# Patient Record
Sex: Female | Born: 1935 | Race: White | Hispanic: No | Smoking: Former smoker
Health system: Southern US, Community
[De-identification: ages and names within clinical notes are randomized; demographics above are authoritative.]

## PROBLEM LIST (undated history)

## (undated) DIAGNOSIS — M1990 Unspecified osteoarthritis, unspecified site: Secondary | ICD-10-CM

## (undated) DIAGNOSIS — E079 Disorder of thyroid, unspecified: Secondary | ICD-10-CM

## (undated) DIAGNOSIS — J449 Chronic obstructive pulmonary disease, unspecified: Secondary | ICD-10-CM

## (undated) HISTORY — PX: ABDOMINAL HYSTERECTOMY: SHX81

---

## 2005-10-12 ENCOUNTER — Emergency Department (HOSPITAL_COMMUNITY): Admission: EM | Admit: 2005-10-12 | Discharge: 2005-10-12 | Payer: Self-pay | Admitting: Emergency Medicine

## 2005-11-11 ENCOUNTER — Inpatient Hospital Stay (HOSPITAL_COMMUNITY): Admission: EM | Admit: 2005-11-11 | Discharge: 2005-11-13 | Payer: Self-pay | Admitting: Emergency Medicine

## 2006-03-20 ENCOUNTER — Inpatient Hospital Stay (HOSPITAL_COMMUNITY): Admission: EM | Admit: 2006-03-20 | Discharge: 2006-03-23 | Payer: Self-pay | Admitting: Emergency Medicine

## 2006-05-19 ENCOUNTER — Encounter (HOSPITAL_COMMUNITY): Admission: RE | Admit: 2006-05-19 | Discharge: 2006-06-18 | Payer: Self-pay | Admitting: Endocrinology

## 2006-07-17 ENCOUNTER — Emergency Department (HOSPITAL_COMMUNITY): Admission: EM | Admit: 2006-07-17 | Discharge: 2006-07-17 | Payer: Self-pay | Admitting: Emergency Medicine

## 2006-08-14 ENCOUNTER — Emergency Department (HOSPITAL_COMMUNITY): Admission: EM | Admit: 2006-08-14 | Discharge: 2006-08-15 | Payer: Self-pay | Admitting: Emergency Medicine

## 2007-02-01 ENCOUNTER — Emergency Department (HOSPITAL_COMMUNITY): Admission: EM | Admit: 2007-02-01 | Discharge: 2007-02-01 | Payer: Self-pay | Admitting: Emergency Medicine

## 2007-02-25 ENCOUNTER — Emergency Department (HOSPITAL_COMMUNITY): Admission: EM | Admit: 2007-02-25 | Discharge: 2007-02-25 | Payer: Self-pay | Admitting: Emergency Medicine

## 2008-11-22 IMAGING — CR DG CHEST 2V
2 series · 2 of 2 positions shown · non-contrast
Comparison: 10/12/2005, 03/20/2006, CT chest 03/22/2006

CLINICAL DATA: COPD. Chest pain.

[view not recorded (1 of 2)]
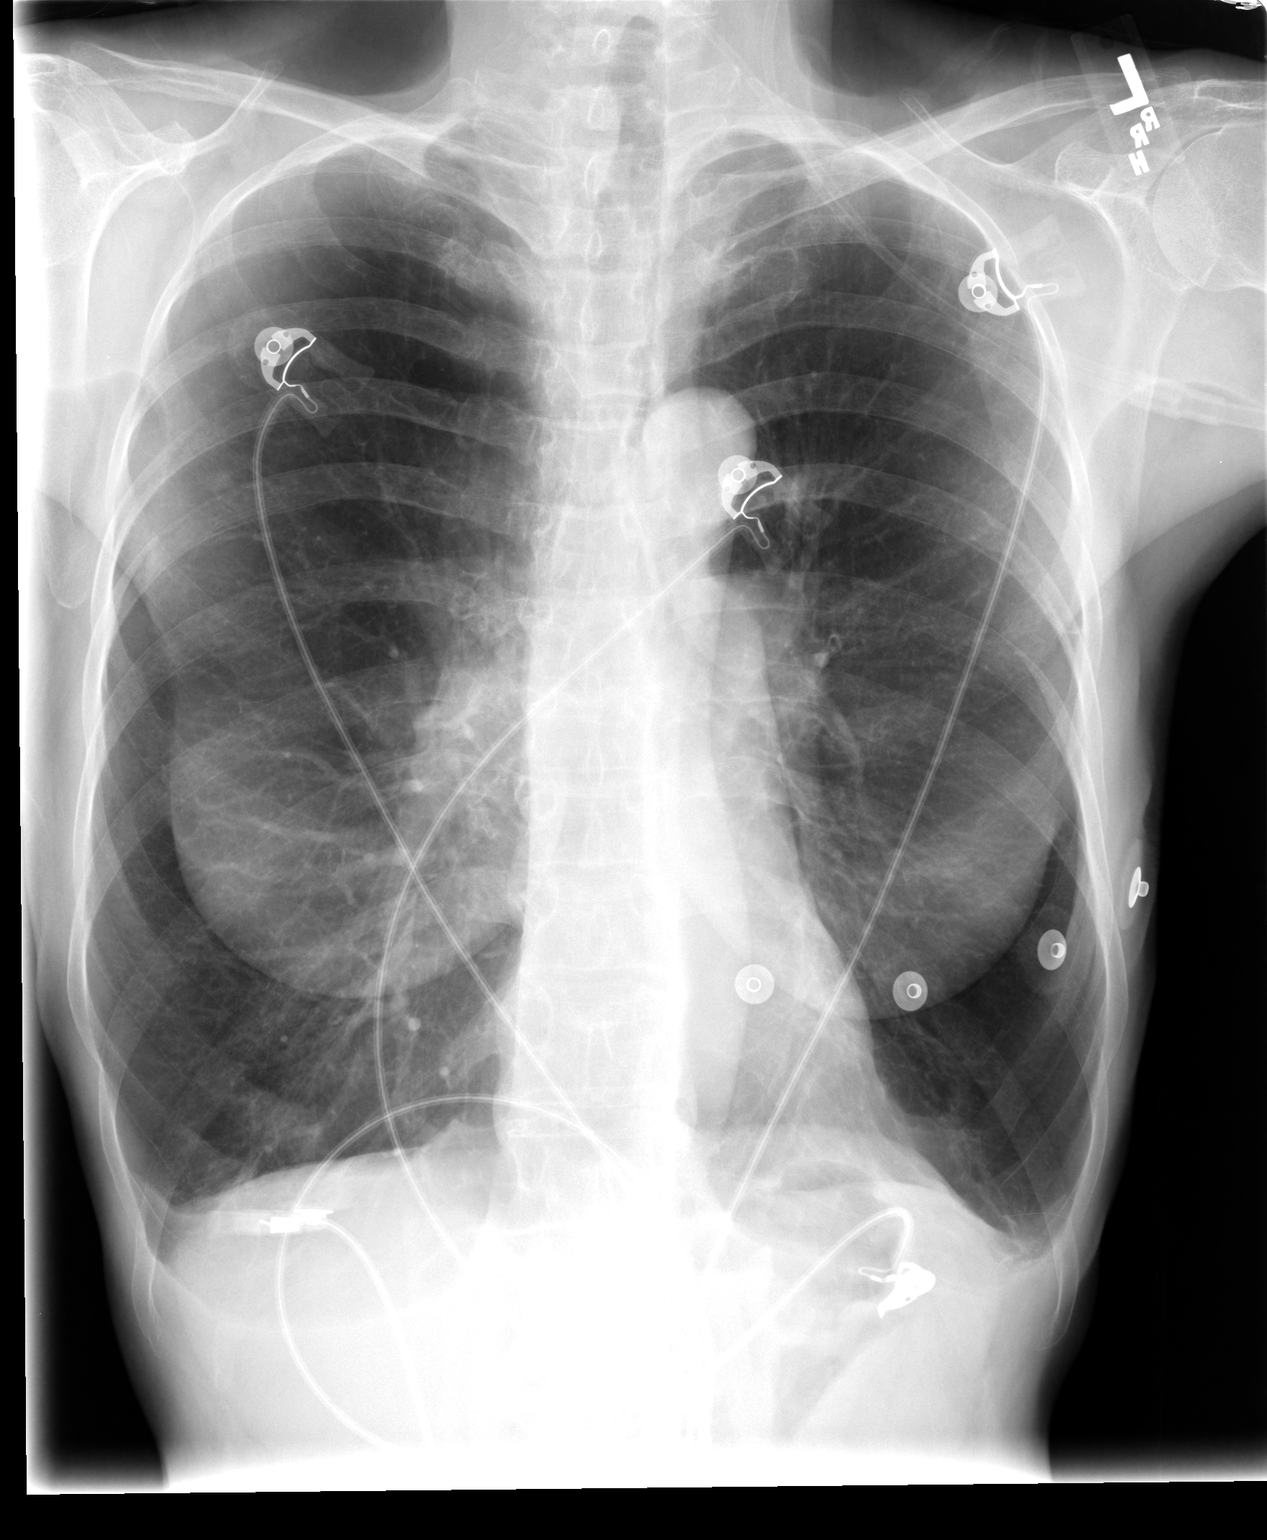

[view not recorded (2 of 2)]
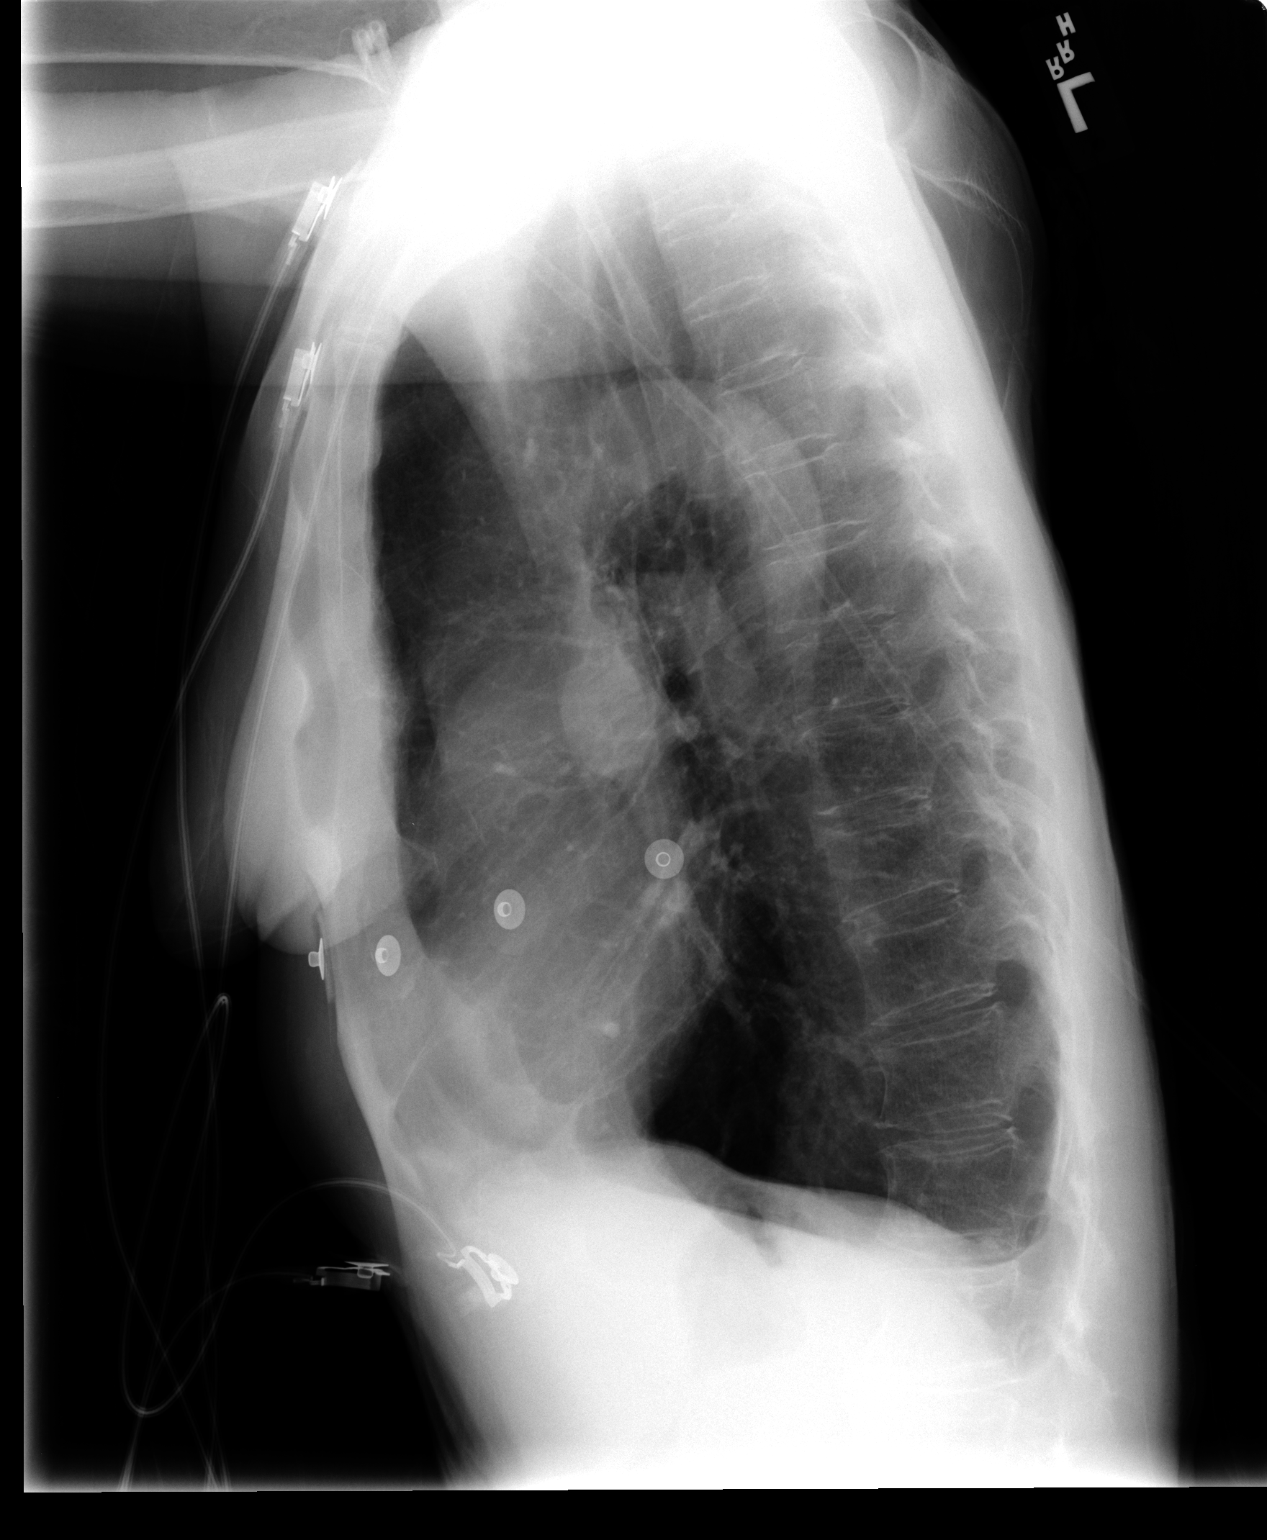

[2 of 2 positions shown; findings below may reference images not displayed]

CHEST - 2 VIEW:

Lungs are hyperexpanded with mild chronic interstitial coarsening. The
cardiopericardial silhouette is within normal limits for size. Pulmonary nodule
is seen in the right upper lobe. This was present as far back as are always
comparison study from 10/12/2005. Would appear slightly more prominent than that
time, it appears unchanged when comparing to 03/20/2006. This was seen on the CT
scan from 03/22/2006 and described as a probable granuloma at that time.
Conservatively, a followup chest x-ray could be performed in 6 months to ensure
that continues remained stable.

There is no pulmonary edema. No focal airspace consolidation. A limited leads
overlie the chest.
IMPRESSION: Emphysema without edema or focal infiltrate.

Right upper lobe pulmonary nodule. Please see report above. The patient did have
a CT scan subsequent to this plain film study. Please see that report for full
details.

This exam was performed during a hospital PACS downtime.  As such, the exam
could not be dictated at the time that it was performed.  A preliminary report
was generated at the time that the exam was completed and that preliminary
report is included in the scanned documents.  A final report is now being
dictated for the medical record.

## 2009-11-28 ENCOUNTER — Ambulatory Visit (HOSPITAL_COMMUNITY): Admission: RE | Admit: 2009-11-28 | Discharge: 2009-11-28 | Payer: Self-pay | Admitting: Ophthalmology

## 2010-02-23 ENCOUNTER — Encounter: Payer: Self-pay | Admitting: *Deleted

## 2010-04-16 LAB — BASIC METABOLIC PANEL
BUN: 9 mg/dL (ref 6–23)
Creatinine, Ser: 0.59 mg/dL (ref 0.4–1.2)
GFR calc non Af Amer: >60 mL/min (ref >60)
Glucose, Bld: 76 mg/dL (ref 70–99)
Potassium: 4.2 mEq/L (ref 3.5–5.1)

## 2010-06-20 NOTE — Discharge Summary (Signed)
NAMEMIRIAM, [name]                 ACCOUNT NO.:  [id number]   MEDICAL RECORD NO.:  [id number]          PATIENT TYPE:  INP   LOCATION:  A322                          FACILITY:  [facility]   PHYSICIAN:  Marcello Moores, MD   DATE OF BIRTH:  1935-05-27   DATE OF ADMISSION:  03/20/2006  DATE OF DISCHARGE:  02/19/2008LH                               DISCHARGE SUMMARY   PRIMARY CARE PHYSICIAN:  Dr. Samuel Jester at [facility].   HOSPITAL COURSE:  [name] is a 75 year old female patient who has  history of COPD and hyperthyroidism. She presented with shortness of  breath and admitted with exacerbation of COPD and was put on Atrovent,  albuterol treatments every 4 hours to which she responded well. During  her hospital stay a chest x-ray was done which shows emphysematous  changes and a small right upper lung nodule with a possible small  effusion.  CAT scan of the chest followed to evaluate the nodule which  showed a benign appearing right upper lobe small nodule. In addition it  showed a large multi-nodular goiter with extension to substernal area  and it was mentioned that a questionable carcinoma cannot be excluded as  well; there is COPD and emphysema. Otherwise the patient was stable and  responded well without any respiratory distress. Today she is ready to  go home.   PHYSICAL EXAMINATION:  GENERAL: The patient is sitting with her husband  on the chair without any distress.  VITAL SIGNS: Blood pressure is 106/72 and temperature is 97.6,  respiratory rate is 20, pulse rate is 76 per minute. She is saturating  94% on room air.  HEENT: Conjunctivae pink, anicteric sclerae.  NECK: Supple with a large thyroid gland.  CHEST: Good air entry, clear, no rhonchi or wheezes.  CVS: S1, S2 regular, no murmur.  ABDOMEN: Flat, soft, no organomegaly, no tenderness, normal active bowel  sounds.  EXTREMITIES: No pedal edema. Peripheral pulses positive.  CNS: Alert and oriented.   LABORATORY DATA:   CBC within normal range. Chemistry was in normal  range. CAT scan of the chest as mentioned above with an enlarged thyroid  gland, multi-nodular questionable carcinoma.   ASSESSMENT DURING DISCHARGE:  1. Chronic obstructive pulmonary disease exacerbation, resolved.  2. Hyperthyroidism with a multi-nodular goiter. She needs ultrasound      of the thyroid gland and a scan.  3. Hypertension, stable.   DISCUSSION/PLAN:  The patient is currently stable but needs ultrasound  of the thyroid gland to evaluate for carcinoma, to be followed by  possible scanning and biopsy but the patient wants to go and have all  this done with her primary care physician, Dr. Charm Barges at [facility] and  request for ultrasound was given. The patient was with her husband and  she had her husband want to go and have follow-up and have all  investigation for the thyroid gland with her primary care physician.  They are aware of the possibilities and they prefer to go there and have  follow-up rather than staying until we investigate the thyroid gland.   HOME MEDICATIONS:  1. Diltiazem 120 mg p.o. once daily.  2. Albuterol/Ipratropium nebulizer q.4 hours.  3. Calcium carbonate 600 mg p.o. once daily.  4. Metamucil 10 mg p.o. b.i.d.  5. Fosamax & vitamin D 70 mg p.o. once a week.   She has all her medications at home and knows how to take it.      Marcello Moores, MD  Electronically Signed     MT/MEDQ  D:  03/23/2006  T:  03/23/2006  Job:  [id number]   cc:   Samuel Jester  Fax: [phone]

## 2010-06-20 NOTE — Discharge Summary (Signed)
[name], [name]                 ACCOUNT NO.:  [id number]   MEDICAL RECORD NO.:  [id number]          PATIENT TYPE:  INP   LOCATION:  A204                          FACILITY:  [facility]   PHYSICIAN:  Hanley Hays. Dechurch, M.D.DATE OF BIRTH:  10/26/1935   DATE OF ADMISSION:  11/11/2005  DATE OF DISCHARGE:  10/12/2007LH                               DISCHARGE SUMMARY   DISCHARGE SUMMARY   DIAGNOSIS:  1. Chronic obstructive pulmonary disease exacerbation, hypoacute.  2. Supra-therapeutic hypothyroidism.  3. Tachycardia.  4. Anemia with iron deficiency and mixed indices.  5. History of gastroesophageal reflux.  6. Tobacco abuse.  7. History of medication noncompliance.   DISPOSITION:  Patient is discharged to home.   MEDICATIONS:  Combivent 2 puffs q.4 hours p.r.n. wheezing.  Prilosec 20 mg daily.  Nu-Iron 150 mg daily.  Levaquin 500 mg to complete a 7-day course.  Cardizem CD 120 mg daily.  Oxygen 2 liters per minutes.   Follow up with Dr. Johny Chess as scheduled.  Follow up with Dr. Charm Barges 1  week.  Condition improved.   HOSPITAL COURSE:  [age]-year-old female with history of COPD  recently hospitalized at [facility] with COPD exacerbation presented to  the hospital with upper respiratory type symptoms initially with  worsening shortness of breath and wheezing and slightly productive  cough.  Evaluation was consistent with acute COPD exacerbation.  She was  also noted to be tachycardic.  She states this was a reaction to her  previous steroid treatment at the other hospital.  Review reveals that  she is hyperthyroid with a TSH of 0.012 and a T4 of 2.33.  She was not  on any known thyroid replacement.  She was also, apparently after  hydration her hemoglobin decreased from 13 to 11.5 and iron indices  obtained revealed a TIBC of 226, total iron 22 and iron sat at 10%.  B12  was 526, RBC folate was 1152, ferritin 160.  Likely related to mixed  issues including iron deficiency.   She clinically improved and was  stable for discharge.  She is being discharged to home with the followup  as noted above.  Her heart rates were reasonably  controlled.  She had no symptomatic hypotension.  Heart rates remained  in the 80s to 90s and blood pressure was 108/60 at the time of  discharge.  She had no fever.  She remained clinically stable and was  discharged to home in stable condition with the plan as noted above.      Hanley Hays Josefine Class, M.D.  Electronically Signed     FED/MEDQ  D:  03/18/2006  T:  03/18/2006  Job:  [id number]

## 2010-06-20 NOTE — H&P (Signed)
NAMEBAYLEA, [name]                 ACCOUNT NO.:  [id number]   MEDICAL RECORD NO.:  [id number]          PATIENT TYPE:  INP   LOCATION:  A322                          FACILITY:  [facility]   PHYSICIAN:  Margaretmary Dys, M.D.DATE OF BIRTH:  1935-11-24   DATE OF ADMISSION:  03/20/2006  DATE OF DISCHARGE:  LH                              HISTORY & PHYSICAL   PRIMARY CARE PHYSICIAN:  The patient is unassigned.   ADMISSION DIAGNOSES:  1. Acute chronic obstructive pulmonary disease exacerbation.  2. Hypoxic respiratory failure.  3. History of tobacco abuse.   CHIEF COMPLAINT:  Increasing shortness of breath and cough for about the  past week.   HISTORY OF PRESENT ILLNESS:  [name] is a 75 year old Caucasian female  who presented to the emergency room with complaints of shortness of  breath and cough over the past one week.  The shortness of breath was  worse over the past two days.  She reports cough with difficulty  bringing up the sputum.  She has had no fevers or chills.  She has no  chest pain.  She has also been hearing herself wheezing.  She tried  using albuterol inhaler  and nebulizers at home without much help.  The  patient is on home oxygen at 2 liters a minute.  She has end-stage COPD  and has been on oxygen for more than a year now.  Her last exacerbation  was back in October 2007.  She denies any angina-like pain, no headaches  or dizziness.  No nausea or vomiting.   REVIEW OF SYSTEMS:  Ten-point Review of Systems otherwise negative  except as mentioned in History of Present Illness.   PAST MEDICAL HISTORY:  1. Hypertension.  2. Hypothyroidism.  3. History of COPD with last exacerbation October 2007.  The patient      reports that she does not take Pneumovax or flu shots.  4. History of tobacco abuse.   MEDICATIONS:  1. Synthroid 1 a day. The dose is not clear.  2. Diltiazem 120 mg p.o. once a day.  3. Albuterol/ipratropium nebulizers q. 4 h.  4. Calcium carbonate  600 mg p.o. once a day.  5. Methimazole 10 mg p.o. b.i.d.  6. Fosamax Plus D 70 mg p.o. once a week.   ALLERGIES:  The patient reports multiple allergies to PREDNISONE,  ASPIRIN, and SULFA.  She also has allergies to [facility] with heart  racing.  These allergies are really unclear, especially the effects of  the prednisone and also the CARDIZEM which she was given when her heart  rate was actually fast.   SOCIAL HISTORY:  The patient is married, lives with husband.  She quit  smoking several years ago but continues to snuff tobacco.  She denies  any alcohol or IV drug abuse.   FAMILY HISTORY:  Positive for hypertension.  No COPD or lung cancer.   PHYSICAL EXAMINATION:  GENERAL:  Conscious, alert, in mild respiratory  distress.  VITAL SIGNS: On arrival in the emergency room showed blood pressure  138/83, pulse 102, respirations 24, temperature 97.3.  Oxygen saturation  initially was 88% on room air.  Temperature was 100% on 2 liters.  HEENT: Normocephalic and atraumatic.  Oral mucosa moist.  No exudates.  NECK:  Supple.  No JVD or lymphadenopathy.  LUNGS:  Bilateral rhonchi with end-expiratory wheezes.  HEART: S1, S2 regular.  No S3, S4, gallops, or rubs.  ABDOMEN: Soft, nontender.  Bowel sounds positive.  No masses palpable.  EXTREMITIES: No edema.   LABORATORY DATA:  Blood gas on room air shows pH 7.38, pCO2 46, pO2 57,  bicarbonate 26.4.  White blood cell count 6.7, hemoglobin 14.5,  hematocrit 43.5.  Her platelet count was 251, no left shift.  D-dimer  was negative.  BMET showed sodium 136, potassium 3.8, chloride 98, CO2  30, glucose 98, creatinine 0.6.  B-natruretic peptide was negative.   Chest x-ray showed emphysematous changes with some nodular densities in  the right lung, possible small effusions.  No obvious pneumonia was  noted.   ASSESSMENT AND PLAN:  [name] is a 75 year old female with history of  end-stage chronic obstructive pulmonary disease on home oxygen.   She  comes in with what looks like a chronic obstructive pulmonary disease  exacerbation with hypoxic respiratory failure.  Will put patient on  oxygen therapy, antibiotics, and also nebulization.  I doubt that she  clearly has an allergy to Solu-Medrol or prednisone and will have to  weigh the benefits and risks if she becomes increasingly more wheezy or  dyspneic.  I have discussed the above plan with her.  Will also resume  all of her home medications at this time.  Deep vein thrombosis  prophylaxis with Lovenox, gastrointestinal prophylaxis with Protonix.      Margaretmary Dys, M.D.  Electronically Signed     AM/MEDQ  D:  03/21/2006  T:  03/21/2006  Job:  [id number]

## 2010-06-20 NOTE — Group Therapy Note (Signed)
[name], [name]                 ACCOUNT NO.:  [id number]   MEDICAL RECORD NO.:  [id number]          PATIENT TYPE:  INP   LOCATION:  A322                          FACILITY:  [facility]   PHYSICIAN:  Margaretmary Dys, M.D.DATE OF BIRTH:  1935-07-15   DATE OF PROCEDURE:  03/21/2006  DATE OF DISCHARGE:                                 PROGRESS NOTE   SUBJECTIVE:  The patient feels slightly better, although she is still  having some trouble with cough with yellow sputum.  She states she  coughs really hard and she begins to wheeze.  She has not had any chest  pain.  No fevers or chills.  No nausea or vomiting.  The patient quit  smoking years ago.   OBJECTIVE:  GENERAL:  Conscious, alert, comfortable, not in acute  distress.  VITAL SIGNS:  Blood pressure 122/80.  Pulse 90.  Respirations 20.  Temperature 97.5.  Oxygen saturation was 93% on 3 liters.  HEENT Exam:  Normocephalic, atraumatic.  Oral mucosa was dry.  No  exudate was noted.  NECK:  Supple.  No JVD or lymphadenopathy.  LUNGS:  Markedly reduced air anteriorly bilaterally with some end-  expiratory wheezes.  HEART:  S1, S2 regular.  No murmurs, gallops, or rubs.  ABDOMEN:  Soft, nontender.  Bowel sounds positive.  No masses palpable.  EXTREMITIES:  No edema.   LABORATORY/DIAGNOSTIC DATA:  White blood cell count was 12.7, hemoglobin  15.4, hematocrit 14.  Platelet count was 258 with no left shift.  Sodium  138.  Potassium 4.1.  Chloride 105.  CO2 28.  Glucose 96.  BUN 7.  Creatinine 0.61.   ASSESSMENT AND PLAN:  Acute chronic obstructive pulmonary disease  exacerbation with hypoxic respiratory failure.  The patient is much  better today.  The patient tells me that she wears 2 liters of oxygen at  home.   Plan is to continue on current therapy with Levaquin and nebulizer.   Solu-Medrol was discontinued yesterday.   May have to restart the prednisone if she continues to wheeze or does  not improve.   Will transfer her to  3A.  Continue with prophylaxis with Lovenox and GI  prophylaxis with Protonix.      Margaretmary Dys, M.D.  Electronically Signed    AM/MEDQ  D:  03/21/2006  T:  03/21/2006  Job:  [id number]

## 2010-06-20 NOTE — H&P (Signed)
[name], [name]                 ACCOUNT NO.:  [id number]   MEDICAL RECORD NO.:  [id number]          PATIENT TYPE:  INP   LOCATION:  A204                          FACILITY:  [facility]   PHYSICIAN:  Lonia Blood, M.D.      DATE OF BIRTH:  1935-10-13   DATE OF ADMISSION:  11/11/2005  DATE OF DISCHARGE:  LH                                HISTORY & PHYSICAL   PRIMARY CARE PHYSICIAN:  The patient is unassigned. [facility] PCP is at  [facility].   PRESENTING COMPLAINT:  Shortness of breath and cough.   HISTORY OF PRESENT ILLNESS:  The patient is a 75 year old female with known  history of COPD who was last seen in the emergency room with onset about a  month ago. She was previously hospitalized at [facility] with COPD exacerbation  and treated accordingly. The patient apparently reacted to Solu-Medrol and  prednisone per patient tachycardiac at that time. At the time she has been  doing okay until the past couple of days when she started having sinus  problems. She was having some drainage in the back of her throat and then  later started having shortness of breath and wheezing. She has been having  cough which is slightly productive. No fever. No chest pain. No hemoptysis.   PAST MEDICAL HISTORY:  Significant for COPD, hypothyroidism, and previous  tobacco use.   ALLERGIES:  Patient reported allergy to SULFA, ASPIRIN, SOLU-MEDROL,  PREDNISONE. SOLU-MEDROL and PREDNISONE cause tachycardia.   SOCIAL HISTORY:  The patient lives alone in [place]. She has some help from  time to time, someone coming into the house to help her out. She quit  tobacco two years ago. No alcohol or IV drug use.   FAMILY HISTORY:  Significant only for hypertension.   MEDICATIONS:  1. Combivent 1 puff b.i.d.  2. Prilosec 20 mg daily.  3. Calcium tablets 600 mg daily.   REVIEW OF SYSTEMS:  A 10-point Review of Systems is mainly per HPI.   PHYSICAL EXAMINATION:  VITAL SIGNS:  Temperature 100.1 rectal, blood  pressure 129/87,  pulse 124, respiratory rate 24, and sats 97% on room air.  GENERAL:  She is awake, alert, in obvious respiratory distress. Cachectic,  chronically ill-looking. HEENT:  PERRL. EOMI.  NECK:  Supple. No JVD, no lymphadenopathy.  RESPIRATORY:  Shows very poor entry bilaterally with some expiratory  wheezing. The patient slumped over, use of accessory muscles for  respiration.  CARDIOVASCULAR:  She is tachycardiac.  ABDOMEN:  Soft, scaphoid, nontender with positive bowel sounds.  EXTREMITIES:  No edema, cyanosis or clubbing.   LABORATORIES:  Showed a white count 14,100 with left shift 12,000.  Hemoglobin 13, platelet count 412. D-dimer is negative at 0.25. Negative  cardiac enzymes. Sodium 139, potassium 3.7, chloride 107, CO2 26, glucose  107, BUN 7, creatinine 0.4. Calcium 9.4. Total protein 5.9. Albumin 3.1. AST  31, ALT 90. Alkaline phosphatase 73, total bilirubin 0.5. BNP 39.4.   Chest x-ray showed emphysema without edema. There was interval increase in  left base atelectasis or infiltrate. There is also a 5  mm nodular density in  the right mid lung. Recommendation for follow-up to review chest x-ray.   ASSESSMENT:  Therefore this is 75 year old female presenting with what  appears to be acute chronic obstructive pulmonary edema exacerbation. This  may have been triggered by concomitant pneumonia versus her postnasal drip  or sinus.   PLAN:  1. COPD exacerbation. Will admit the patient. Start her on nebulizers and      antibiotics. The patient apparently believes she is reacting to      steroids. She has had this tachycardia continuously. She reacted only      to prednisone and Solu-Medrol, but it is not clear if those are truly      the cause of her tachycardia as she is already tachycardiac as we      speak. I will give her a trial of Decadron and if needed will use      calcium channel blockers to slow down her heart. Will avoid beta      blockers because of her respiratory  situation.  2. Hypothyroidism. Will check TSH and follow up patient closely.  3. GERD. I will continue some PPIs.  4. Sinusitis. This is per patient's report. I will put her on some nasal      steroids and possibly an H2 blocker. Otherwise, will continue with      current patient's management without a problem.      Lonia Blood, M.D.  Electronically Signed     LG/MEDQ  D:  11/11/2005  T:  11/11/2005  Job:  [id number]

## 2010-10-23 LAB — COMPREHENSIVE METABOLIC PANEL
AST: 17
CO2: 33 — ABNORMAL HIGH
Calcium: 9.5
Creatinine, Ser: 0.68
GFR calc Af Amer: >60
GFR calc non Af Amer: >60
Glucose, Bld: 91

## 2010-10-23 LAB — URINE CULTURE: Colony Count: 6000

## 2010-10-23 LAB — URINALYSIS, ROUTINE W REFLEX MICROSCOPIC
Nitrite: NEGATIVE
Specific Gravity, Urine: 1.01
pH: 7.5

## 2010-10-23 LAB — POCT CARDIAC MARKERS: Myoglobin, poc: 53.6

## 2010-10-23 LAB — CBC
MCHC: 33.1
MCV: 83.8
RBC: 4.83

## 2010-10-23 LAB — DIFFERENTIAL
Lymphocytes Relative: 15
Lymphs Abs: 1.6
Neutrophils Relative %: 77

## 2010-10-23 LAB — TSH: TSH: 0.619

## 2010-10-31 ENCOUNTER — Inpatient Hospital Stay (HOSPITAL_COMMUNITY): Payer: Medicare Other

## 2010-10-31 ENCOUNTER — Emergency Department (HOSPITAL_COMMUNITY): Payer: Medicare Other

## 2010-10-31 ENCOUNTER — Encounter (HOSPITAL_COMMUNITY): Payer: Self-pay | Admitting: Emergency Medicine

## 2010-10-31 ENCOUNTER — Inpatient Hospital Stay (HOSPITAL_COMMUNITY)
Admission: EM | Admit: 2010-10-31 | Discharge: 2010-11-08 | DRG: 190 | Disposition: A | Discharge status: Skilled Nursing Facility | Payer: Medicare Other | Attending: Internal Medicine | Admitting: Internal Medicine

## 2010-10-31 DIAGNOSIS — Z299 Encounter for prophylactic measures, unspecified: Secondary | ICD-10-CM

## 2010-10-31 DIAGNOSIS — Z681 Body mass index (BMI) 19 or less, adult: Secondary | ICD-10-CM

## 2010-10-31 DIAGNOSIS — J9620 Acute and chronic respiratory failure, unspecified whether with hypoxia or hypercapnia: Secondary | ICD-10-CM | POA: Diagnosis present

## 2010-10-31 DIAGNOSIS — R000 Tachycardia, unspecified: Secondary | ICD-10-CM | POA: Diagnosis present

## 2010-10-31 DIAGNOSIS — E0590 Thyrotoxicosis, unspecified without thyrotoxic crisis or storm: Secondary | ICD-10-CM | POA: Diagnosis present

## 2010-10-31 DIAGNOSIS — R197 Diarrhea, unspecified: Secondary | ICD-10-CM | POA: Diagnosis not present

## 2010-10-31 DIAGNOSIS — J441 Chronic obstructive pulmonary disease with (acute) exacerbation: Principal | ICD-10-CM | POA: Diagnosis present

## 2010-10-31 DIAGNOSIS — R0902 Hypoxemia: Secondary | ICD-10-CM | POA: Diagnosis present

## 2010-10-31 DIAGNOSIS — E079 Disorder of thyroid, unspecified: Secondary | ICD-10-CM | POA: Diagnosis present

## 2010-10-31 DIAGNOSIS — Z9981 Dependence on supplemental oxygen: Secondary | ICD-10-CM

## 2010-10-31 DIAGNOSIS — IMO0001 Reserved for inherently not codable concepts without codable children: Secondary | ICD-10-CM

## 2010-10-31 DIAGNOSIS — E876 Hypokalemia: Secondary | ICD-10-CM | POA: Diagnosis present

## 2010-10-31 DIAGNOSIS — E43 Unspecified severe protein-calorie malnutrition: Secondary | ICD-10-CM | POA: Diagnosis present

## 2010-10-31 DIAGNOSIS — E41 Nutritional marasmus: Secondary | ICD-10-CM | POA: Diagnosis present

## 2010-10-31 DIAGNOSIS — Z87891 Personal history of nicotine dependence: Secondary | ICD-10-CM

## 2010-10-31 HISTORY — DX: Chronic obstructive pulmonary disease, unspecified: J44.9

## 2010-10-31 HISTORY — DX: Disorder of thyroid, unspecified: E07.9

## 2010-10-31 HISTORY — DX: Unspecified osteoarthritis, unspecified site: M19.90

## 2010-10-31 LAB — URINALYSIS, ROUTINE W REFLEX MICROSCOPIC
Bilirubin Urine: NEGATIVE
Specific Gravity, Urine: 1.02 (ref 1.005–1.030)
Urobilinogen, UA: 0.2 mg/dL (ref 0.0–1.0)

## 2010-10-31 LAB — BASIC METABOLIC PANEL
CO2: 45 mEq/L (ref 19–32)
Chloride: 93 mEq/L — ABNORMAL LOW (ref 96–112)
Creatinine, Ser: <0.47 mg/dL — ABNORMAL LOW (ref 0.50–1.10)
Potassium: 2.6 mEq/L — CL (ref 3.5–5.1)

## 2010-10-31 LAB — D-DIMER, QUANTITATIVE: D-Dimer, Quant: 1.09 ug/mL-FEU — ABNORMAL HIGH (ref 0.00–0.48)

## 2010-10-31 LAB — CBC
HCT: 38 % (ref 36.0–46.0)
MCV: 90.7 fL (ref 78.0–100.0)
Platelets: 173 10*3/uL (ref 150–400)
RBC: 4.19 MIL/uL (ref 3.87–5.11)
WBC: 10.8 10*3/uL — ABNORMAL HIGH (ref 4.0–10.5)

## 2010-10-31 LAB — CARDIAC PANEL(CRET KIN+CKTOT+MB+TROPI)
CK, MB: 7.8 ng/mL (ref 0.3–4.0)
Troponin I: <0.3 ng/mL (ref <0.30)

## 2010-10-31 MED ORDER — SODIUM CHLORIDE 0.9 % IJ SOLN
3.0000 mL | Freq: Two times a day (BID) | INTRAMUSCULAR | Status: DC
  Administered 2010-10-31 – 2010-11-08 (×13): 3 mL via INTRAVENOUS
  Filled 2010-10-31 (×11): qty 3

## 2010-10-31 MED ORDER — FLUTICASONE-SALMETEROL 250-50 MCG/DOSE IN AEPB
1.0000 | INHALATION_SPRAY | Freq: Two times a day (BID) | RESPIRATORY_TRACT | Status: DC
  Administered 2010-10-31 – 2010-11-08 (×9): 1 via RESPIRATORY_TRACT
  Filled 2010-10-31 (×2): qty 14

## 2010-10-31 MED ORDER — POTASSIUM CHLORIDE 10 MEQ/100ML IV SOLN
10.0000 meq | INTRAVENOUS | Status: AC
  Administered 2010-10-31 (×3): 10 meq via INTRAVENOUS
  Filled 2010-10-31 (×2): qty 100

## 2010-10-31 MED ORDER — GUAIFENESIN ER 600 MG PO TB12
600.0000 mg | ORAL_TABLET | Freq: Two times a day (BID) | ORAL | Status: DC
  Administered 2010-10-31 – 2010-11-08 (×16): 600 mg via ORAL
  Filled 2010-10-31 (×16): qty 1

## 2010-10-31 MED ORDER — LEVALBUTEROL HCL 0.63 MG/3ML IN NEBU
0.6300 mg | INHALATION_SOLUTION | Freq: Four times a day (QID) | RESPIRATORY_TRACT | Status: DC
  Administered 2010-10-31 – 2010-11-08 (×31): 0.63 mg via RESPIRATORY_TRACT
  Filled 2010-10-31 (×33): qty 3

## 2010-10-31 MED ORDER — HYDROCODONE-ACETAMINOPHEN 5-325 MG PO TABS
1.0000 | ORAL_TABLET | ORAL | Status: DC | PRN
  Filled 2010-10-31: qty 2

## 2010-10-31 MED ORDER — MECLIZINE HCL 12.5 MG PO TABS: 25.0000 mg | ORAL_TABLET | Freq: Three times a day (TID) | ORAL | Status: DC | PRN

## 2010-10-31 MED ORDER — SODIUM CHLORIDE 0.9 % IV SOLN
250.0000 mL | INTRAVENOUS | Status: DC
  Administered 2010-10-31: 250 mL via INTRAVENOUS

## 2010-10-31 MED ORDER — SODIUM CHLORIDE 0.9 % IJ SOLN
3.0000 mL | INTRAMUSCULAR | Status: DC | PRN
  Filled 2010-10-31 (×2): qty 3

## 2010-10-31 MED ORDER — GUAIFENESIN-DM 100-10 MG/5ML PO SYRP: 5.0000 mL | ORAL_SOLUTION | ORAL | Status: DC | PRN

## 2010-10-31 MED ORDER — METHYLPREDNISOLONE SODIUM SUCC 125 MG IJ SOLR
125.0000 mg | Freq: Once | INTRAMUSCULAR | Status: AC
  Administered 2010-10-31: 125 mg via INTRAVENOUS
  Filled 2010-10-31: qty 2

## 2010-10-31 MED ORDER — ALBUTEROL SULFATE (5 MG/ML) 0.5% IN NEBU
INHALATION_SOLUTION | RESPIRATORY_TRACT | Status: AC
  Administered 2010-10-31: 10 mg
  Filled 2010-10-31: qty 2

## 2010-10-31 MED ORDER — ALUM & MAG HYDROXIDE-SIMETH 200-200-20 MG/5ML PO SUSP: 30.0000 mL | Freq: Four times a day (QID) | ORAL | Status: DC | PRN

## 2010-10-31 MED ORDER — ACETAMINOPHEN 325 MG PO TABS: 650.0000 mg | ORAL_TABLET | Freq: Four times a day (QID) | ORAL | Status: DC | PRN

## 2010-10-31 MED ORDER — BIOTENE DRY MOUTH MT LIQD: OROMUCOSAL | Status: DC | PRN

## 2010-10-31 MED ORDER — ALBUTEROL (5 MG/ML) CONTINUOUS INHALATION SOLN
10.0000 mg/h | INHALATION_SOLUTION | RESPIRATORY_TRACT | Status: DC
  Filled 2010-10-31: qty 20

## 2010-10-31 MED ORDER — VITAMIN D3 25 MCG (1000 UNIT) PO TABS
1000.0000 [IU] | ORAL_TABLET | Freq: Every day | ORAL | Status: DC
  Administered 2010-10-31 – 2010-11-08 (×7): 1000 [IU] via ORAL
  Filled 2010-10-31 (×11): qty 1

## 2010-10-31 MED ORDER — POTASSIUM CHLORIDE 10 MEQ/100ML IV SOLN
INTRAVENOUS | Status: AC
  Administered 2010-10-31: 10 meq via INTRAVENOUS
  Filled 2010-10-31: qty 100

## 2010-10-31 MED ORDER — DEXTROSE 5 % IV SOLN
500.0000 mg | INTRAVENOUS | Status: DC
  Administered 2010-10-31 – 2010-11-03 (×4): 500 mg via INTRAVENOUS
  Filled 2010-10-31 (×5): qty 500

## 2010-10-31 MED ORDER — ENOXAPARIN SODIUM 40 MG/0.4ML ~~LOC~~ SOLN
40.0000 mg | SUBCUTANEOUS | Status: DC
  Administered 2010-10-31: 40 mg via SUBCUTANEOUS
  Filled 2010-10-31: qty 0.4

## 2010-10-31 MED ORDER — LEVALBUTEROL HCL 0.63 MG/3ML IN NEBU
0.6300 mg | INHALATION_SOLUTION | Freq: Four times a day (QID) | RESPIRATORY_TRACT | Status: DC | PRN
  Administered 2010-11-02: 0.63 mg via RESPIRATORY_TRACT
  Filled 2010-10-31: qty 3

## 2010-10-31 MED ORDER — POTASSIUM CHLORIDE CRYS ER 20 MEQ PO TBCR
40.0000 meq | EXTENDED_RELEASE_TABLET | Freq: Once | ORAL | Status: AC
  Administered 2010-10-31: 40 meq via ORAL
  Filled 2010-10-31: qty 2

## 2010-10-31 MED ORDER — CALCIUM CARBONATE-VITAMIN D 500-200 MG-UNIT PO TABS
1.0000 | ORAL_TABLET | Freq: Every day | ORAL | Status: DC
  Administered 2010-10-31 – 2010-11-08 (×8): 1 via ORAL
  Filled 2010-10-31 (×10): qty 1

## 2010-10-31 MED ORDER — IPRATROPIUM BROMIDE 0.02 % IN SOLN
0.5000 mg | Freq: Four times a day (QID) | RESPIRATORY_TRACT | Status: DC
  Administered 2010-10-31 – 2010-11-08 (×33): 0.5 mg via RESPIRATORY_TRACT
  Filled 2010-10-31 (×33): qty 2.5

## 2010-10-31 MED ORDER — METHIMAZOLE 5 MG PO TABS
2.5000 mg | ORAL_TABLET | ORAL | Status: DC
  Administered 2010-10-31 – 2010-11-08 (×4): 2.5 mg via ORAL
  Filled 2010-10-31 (×6): qty 1

## 2010-10-31 MED ORDER — DEXTROSE 5 % IV SOLN
1.0000 g | INTRAVENOUS | Status: DC
  Administered 2010-10-31 – 2010-11-06 (×7): 1 g via INTRAVENOUS
  Filled 2010-10-31 (×10): qty 1

## 2010-10-31 MED ORDER — ACETAMINOPHEN 650 MG RE SUPP: 650.0000 mg | Freq: Four times a day (QID) | RECTAL | Status: DC | PRN

## 2010-10-31 MED ORDER — POTASSIUM CHLORIDE 20 MEQ PO PACK
40.0000 meq | PACK | Freq: Once | ORAL | Status: AC
  Administered 2010-10-31: 20 meq via ORAL
  Filled 2010-10-31: qty 2

## 2010-10-31 NOTE — ED Notes (Signed)
Dr. Ignacia Palma notified of abnormal labs

## 2010-10-31 NOTE — ED Notes (Signed)
Pt. States she is unable to stand for chest x-ray--states she feel too weak--changed to portable.

## 2010-10-31 NOTE — ED Notes (Signed)
Report called to Inetta Fermo, RN--She will be admitted to 328.  Continues to improve--HR 107  R 24  P.O. 98% on 2.5 liters.

## 2010-10-31 NOTE — Progress Notes (Signed)
CRITICAL VALUE ALERT  Critical value received:  CKMB 7.3  Date of notification: 10/31/2010  Time of notification: 1900  Critical value read back:yes  Nurse who received alert:  Aline August RN  MD notified (1st page):  RAI  Time of first page:  1900  MD notified (2nd page):  Time of second page:  Responding MD:   Time MD responded:

## 2010-10-31 NOTE — ED Notes (Signed)
B/P 128./83  R 28   Oulse ox 95% on 2.5 liters--Solu Medrol 125 mg given very very slow IVP--Pt. Tolerated well and is beginning to report she is feeling better.  Family member here and in to see pt.

## 2010-10-31 NOTE — H&P (Signed)
PCP:   BUTLER,CYNTHIA, DO, DO   Chief Complaint:  Shortness of breath with wheezing significantly worsened today  HPI: Briefly Ms. Marcelle Overlie is a 75 year old Caucasian female with history of advanced COPD, chronic respiratory failure, oxygen dependent on 2 L all the time, history of arthritis, asthma, hypothyroidism, presented to the emergency department complaining of worsening shortness of breath since morning today. Patient states that she has been having cough productive of thick white sputum and shortness of breath over last few days however significantly worsened since morning today. Patient denies any fevers or chills or any chest pain, abdominal pain, nausea, vomiting, any diarrhea or constipation. Patient states that she does have albuterol nebulizers at home but does not use them however she does use albuterol inhaler 4 times a day. She denies any prior history of intubation, any recent travels or sick contacts. In the emergency room, patient was given albuterol breathing treatments upon which she developed significant tachycardia from 100-140. Patient also states that she is allergic to prednisone.    Review of Systems:  The patient denies anorexia, fever, weight loss,, vision loss, decreased hearing, hoarseness, chest pain, syncope, (+) dyspnea on exertion, peripheral edema, balance deficits, hemoptysis, abdominal pain, melena, hematochezia, severe indigestion/heartburn, hematuria, incontinence, genital sores, muscle weakness, suspicious skin lesions, transient blindness, difficulty walking, depression, unusual weight change, abnormal bleeding, enlarged lymph nodes, angioedema, and breast masses.  Past Medical History: Past Medical History  Diagnosis Date  . Arthritis   . Asthma   . COPD (chronic obstructive pulmonary disease)   . Thyroid disorder 10/31/2010  Chronic respiratory failure, oxygen dependent 2-3L continous Past Surgical History  Procedure Date  . Abdominal hysterectomy       Medications: Prior to Admission medications   Medication Sig Start Date End Date Taking? Authorizing Provider  Calcium Carbonate-Vitamin D (CALCIUM 600+D) 600-400 MG-UNIT per tablet Take 1 tablet by mouth daily.     Yes Historical Provider, MD  cholecalciferol (VITAMIN D) 1000 UNITS tablet Take 1,000 Units by mouth daily.     Yes Historical Provider, MD  meclizine (ANTIVERT) 25 MG tablet Take 25 mg by mouth 4 (four) times daily as needed. FOR NAUSEA     Yes Historical Provider, MD  methimazole (TAPAZOLE) 5 MG tablet Take 2.5 mg by mouth every other day. PATIENT CALLS THIS HER "THYROID PILL"    Yes Historical Provider, MD    Allergies:   Allergies  Allergen Reactions  . Food Shortness Of Breath    ONIONS  . Promist La (Pseudoephedrine-Guaifenesin) Shortness Of Breath  . Sulfa Antibiotics Nausea And Vomiting  . Aspirin Other (See Comments)    "FELT AS IF SHE WOULD DIE"  . Iodine Other (See Comments)    FELT "BAD" EXAUSTED   . Latex Itching    PLASTIC BANDAIDS  . Prednisone Palpitations    SPEEDS HEART RATE    Social History:  reports that she has quit smoking 6 years ago, previously smoked about half a pack per day for several years. She does not have any smokeless tobacco history on file. She reports that she does not drink alcohol or use illicit drugs. She lives at home alone but states that her niece checks up on her every day. She states that she ambulates without any assistance normally but when her COPD is acting up, she uses a walker or wheelchair.   Family History: History reviewed. No pertinent family history.  Physical Exam: Filed Vitals:   10/31/10 1145 10/31/10 1325 10/31/10 1453 10/31/10 1517  BP: 104/86 137/69 128/80 132/83  Pulse: 25 119 107 107  Temp:  98.8 F (37.1 C) 98.8 F (37.1 C) 98.2 F (36.8 C)  TempSrc:   Oral Oral  Resp:  20 28 26   Height:    5\' 2"  (1.575 m)  Weight:    35.925 kg (79 lb 3.2 oz)  SpO2: 94% 96% 98% 86%   General appearance:  Alert and oriented x3 in mild respiratory distress but able to complete full sentences Head: Atraumatic normocephalic Eyes: EOMI anicteric sclera Ears: Clear no ear discharge Throat: Clear no pharyngeal exudates Neck: Supple no lymphadenopathy no JVD Resp: Bilateral diminished breath sounds throughout with scattered wheezing Cardio:S1-S2 clear regular rate and rhythm tachycardia  GI: Soft nontender nondistended normal bowel sounds Extremities: No cyanosis clubbing or edema noted in upper or lower extremities bilaterally  Skin: Warm and dry no rashes Neurologic: No focal neurologic deficits noted cranial nerves 2-12 intact   Labs on Admission:   [facility] 10/31/10 1033  NA 144  K 2.6*  CL 93*  CO2 45*  GLUCOSE 106*  BUN 7  CREATININE <0.47*  CALCIUM 9.9  MG --  PHOS --   No results found for this basename: AST:2,ALT:2,ALKPHOS:2,BILITOT:2,PROT:2,ALBUMIN:2 in the last 72 hours No results found for this basename: LIPASE:2,AMYLASE:2 in the last 72 hours  Basename 10/31/10 1033  WBC 10.8*  NEUTROABS --  HGB 11.7*  HCT 38.0  MCV 90.7  PLT 173   No results found for this basename: CKTOTAL:3,CKMB:3,CKMBINDEX:3,TROPONINI:3 in the last 72 hours No results found for this basename: TSH,T4TOTAL,FREET3,T3FREE,THYROIDAB in the last 72 hours No results found for this basename: VITAMINB12:2,FOLATE:2,FERRITIN:2,TIBC:2,IRON:2,RETICCTPCT:2 in the last 72 hours  Radiological Exams on Admission: Dg Chest Portable 1 View  10/31/2010  *RADIOLOGY REPORT*  Clinical Data: History of COPD, asthma, now with shortness of breath  PORTABLE CHEST - 1 VIEW  Comparison: 02/25/2007; 02/01/2007; 08/14/2006; chest CT - 02/01/2007  Findings: Unchanged cardiac silhouette and mediastinal contours with mild tortuosity of the descending thoracic aorta.  The lungs remain hyperinflated with flattening of the bilateral hemidiaphragms.  No new focal parenchymal opacities.  No definite pleural effusion or pneumothorax.   Unchanged bones.  IMPRESSION: Hyperinflated lungs without acute cardiopulmonary disease.  Further evaluation may be performed with a PA and lateral chest radiograph as clinically indicated.  Original Report Authenticated By: Waynard Reeds, M.D.    Assessment/Plan Present on Admission:  .COPD exacerbation / With acute on chronic respiratory failure, oxygen dependent 2-3 L/ hypoxia: - Admit to telemetry monitored floor, given hypokalemia and tachycardia, obtain stat d-dimer and cardiac enzymes. D-dimer was elevated hence I will pursue V/Q scan to rule out any pulmonary embolism - Patient had significant tachycardia with albuterol nebs, I will place her on Xopenex and ipratropium nebulizer treatment treatments. Continue oxygen supplementation and Advair diskus. Place on IV Zithromax and Rocephin, Mucinex and Robitussin, I will hold off on IV Solu-Medrol or by mouth prednisone, given patient's concern about the allergy and palpitations. Will likely need home O2 evaluation at the time of discharge.   .Tachycardia: Likely secondary to multiple albuterol nebulizer treatments in the emergency room, obtain TSH and cardiac enzymes, rule out PE.    Marland KitchenThyroid disorder: Obtain TSH and placed on methimazole   .Hypokalemia: Placed on aggressive IV and by mouth potassium replacement and recheck level after 4 hours   Prophylaxis: Lovenox for DVT prophylaxis  CODE STATUS full code per patient's wishes  RAI,RIPUDEEP 10/31/2010, 3:43 PM

## 2010-10-31 NOTE — ED Notes (Signed)
Shortness of air and productive cough of white secretions--onset last p.m.

## 2010-10-31 NOTE — ED Notes (Signed)
ERROR--Pt. RCD 40 meq of Klor-Con

## 2010-10-31 NOTE — ED Notes (Signed)
HR 112  P.O. 98% on 2.5 liters--sleeping on carrier---awakens with verbal--awaiting admission/internal med. consult

## 2010-10-31 NOTE — ED Notes (Signed)
Reports feeling better and less SOA  HR 115-120  Pulse OX 95%  R 28--Will continue to monitor closely

## 2010-10-31 NOTE — ED Notes (Addendum)
Pt. Continues to c/o  SOA and "feeling funny"---breathing trt via mask is still in progress.  Pulse ox down to 91%--d/cd breathing trt--notified resp. Therapy -HR 144---Resp 32---placed pt back on 02 at 2.5 liters---lung sounds are diminished.  Dr. Ignacia Palma notified and back in to assess pt.  Moved pt. To Room 14 in front of nurse's station---Dr. Ignacia Palma discussing with pt., the need for Solu Medrol IV and she is agreeable.

## 2010-10-31 NOTE — ED Provider Notes (Signed)
History   Scribed for Ripudeep Rai, the patient was seen in room APA14/APA14. This chart was scribed by [name]. This patient's care was started at 10:56AM.  CSN: [id number] Arrival date & time: 10/31/2010 10:14 AM  Chief Complaint  Patient presents with  . Shortness of Breath    .---uses 02 at 2 on a continuous basis at home--productive cough of white secretions   HPI [name] is a 75 y.o. female who presents to the Emergency Department complaining of constant SOB onset this morning and persistent since with productive cough with thick white sputum. Denies fever, chest pain, abdominal pain, ear pain. Patient reports SOB mildly relieved with use of albuterol inhaler. Patient states she was hospitalized in 2009 with either COPD exacerbation or pneumonia but is unsure of which condition. Patient with h/o COPD but denies h/o diabetes, hypertension. Patient with surgical h/o hysterectomy.  HPI ELEMENTS: Onset: this morning Duration: persistent since onset  Timing: constant    Modifying factors: Mildly relieved with use of albuterol inhaler  Context:  as above  Associated symptoms: +productive cough with thick white sputum. Denies fever, chest pain, abdominal pain, ear pain.   PAST MEDICAL HISTORY:  Past Medical History  Diagnosis Date  . Arthritis   . Asthma   . COPD (chronic obstructive pulmonary disease)     PAST SURGICAL HISTORY:  Past Surgical History  Procedure Date  . Abdominal hysterectomy     FAMILY HISTORY:  History reviewed. No pertinent family history.   SOCIAL HISTORY: History   Social History  . Marital Status: Widowed    Spouse Name: N/A    Number of Children: N/A  . Years of Education: N/A   Social History Main Topics  . Smoking status: Former Games developer  . Smokeless tobacco: None  . Alcohol Use: No  . Drug Use: No  . Sexually Active: No   Other Topics Concern  . None   Social History Narrative  . None     Review of Systems 10 Systems  reviewed and are negative for acute change except as noted in the HPI.  Allergies  Food; Promist la; Sulfa antibiotics; Aspirin; Iodine; Latex; and Prednisone  Home Medications   Current Outpatient Rx  Name Route Sig Dispense Refill  . ALBUTEROL SULFATE HFA 108 (90 BASE) MCG/ACT IN AERS Inhalation Inhale 2 puffs into the lungs every 4 (four) hours as needed. FOR SHORTNESS OF BREATH     . CALCIUM CARBONATE-VITAMIN D 600-400 MG-UNIT PO TABS Oral Take 1 tablet by mouth daily.      Marland Kitchen VITAMIN D 1000 UNITS PO TABS Oral Take 1,000 Units by mouth daily.      Marland Kitchen MECLIZINE HCL 25 MG PO TABS Oral Take 25 mg by mouth 4 (four) times daily as needed. FOR NAUSEA      . METHIMAZOLE 5 MG PO TABS Oral Take 2.5 mg by mouth every other day. PATIENT CALLS THIS HER "THYROID PILL"       BP 104/86  Pulse 25  Temp(Src) 99 F (37.2 C) (Oral)  Resp 26  Ht 5' (1.524 m)  Wt 84 lb (38.102 kg)  BMI 16.41 kg/m2  SpO2 94%  Physical Exam  Nursing note and vitals reviewed. Constitutional: She is oriented to person, place, and time. She appears well-developed and well-nourished. No distress.  HENT:  Head: Normocephalic and atraumatic.  Right Ear: Tympanic membrane normal.  Left Ear: Tympanic membrane normal.  Mouth/Throat: Oropharynx is clear and moist.  Eyes: EOM are  normal. Pupils are equal, round, and reactive to light.  Neck: Neck supple.       No carotid bruits bilaterally.   Cardiovascular: Regular rhythm.  Tachycardia present.  Exam reveals no gallop and no friction rub.   No murmur heard. Pulmonary/Chest: Effort normal. She has no wheezes. She has no rales.       She has distant breath sounds.  Abdominal: Soft. Bowel sounds are normal. She exhibits no distension. There is no tenderness.  Musculoskeletal: She exhibits no edema.  Neurological: She is alert and oriented to person, place, and time. No sensory deficit.  Skin: Skin is warm and dry.  Psychiatric: She has a normal mood and affect. Her  behavior is normal.    ED Course  Procedures MDM   OTHER DATA REVIEWED: Nursing notes, vital signs, and past medical records reviewed. Lab results reviewed and considered Imaging results reviewed and considered  DIAGNOSTIC STUDIES: Oxygen Saturation is 99% on Nasal canula-2L, normal by my interpretation.    LABS / RADIOLOGY: Results for orders placed during the hospital encounter of 10/31/10  CBC      Component Value Range   WBC 10.8 (*) 4.0 - 10.5 (K/uL)   RBC 4.19  3.87 - 5.11 (MIL/uL)   Hemoglobin 11.7 (*) 12.0 - 15.0 (g/dL)   HCT 16.1  09.6 - 04.5 (%)   MCV 90.7  78.0 - 100.0 (fL)   MCH 27.9  26.0 - 34.0 (pg)   MCHC 30.8  30.0 - 36.0 (g/dL)   RDW 40.9  81.1 - 91.4 (%)   Platelets 173  150 - 400 (K/uL)  BASIC METABOLIC PANEL      Component Value Range   Sodium 144  135 - 145 (mEq/L)   Potassium 2.6 (*) 3.5 - 5.1 (mEq/L)   Chloride 93 (*) 96 - 112 (mEq/L)   CO2 45 (*) 19 - 32 (mEq/L)   Glucose, Bld 106 (*) 70 - 99 (mg/dL)   BUN 7  6 - 23 (mg/dL)   Creatinine, Ser <7.82 (*) 0.50 - 1.10 (mg/dL)   Calcium 9.9  8.4 - 95.6 (mg/dL)   GFR calc non Af Amer NOT CALCULATED  >60 (mL/min)   GFR calc Af Amer NOT CALCULATED  >60 (mL/min)  URINALYSIS, ROUTINE W REFLEX MICROSCOPIC      Component Value Range   Color, Urine YELLOW  YELLOW    Appearance CLEAR  CLEAR    Specific Gravity, Urine 1.020  1.005 - 1.030    pH 8.0  5.0 - 8.0    Glucose, UA NEGATIVE  NEGATIVE (mg/dL)   Hgb urine dipstick TRACE (*) NEGATIVE    Bilirubin Urine NEGATIVE  NEGATIVE    Ketones, ur 40 (*) NEGATIVE (mg/dL)   Protein, ur TRACE (*) NEGATIVE (mg/dL)   Urobilinogen, UA 0.2  0.0 - 1.0 (mg/dL)   Nitrite NEGATIVE  NEGATIVE    Leukocytes, UA NEGATIVE  NEGATIVE   URINE MICROSCOPIC-ADD ON      Component Value Range   RBC / HPF 0-2  <3 (RBC/hpf)   Bacteria, UA FEW (*) RARE    Dg Chest Portable 1 View  10/31/2010  *RADIOLOGY REPORT*  Clinical Data: History of COPD, asthma, now with shortness of breath   PORTABLE CHEST - 1 VIEW  Comparison: 02/25/2007; 02/01/2007; 08/14/2006; chest CT - 02/01/2007  Findings: Unchanged cardiac silhouette and mediastinal contours with mild tortuosity of the descending thoracic aorta.  The lungs remain hyperinflated with flattening of the bilateral hemidiaphragms.  No new  focal parenchymal opacities.  No definite pleural effusion or pneumothorax.  Unchanged bones.  IMPRESSION: Hyperinflated lungs without acute cardiopulmonary disease.  Further evaluation may be performed with a PA and lateral chest radiograph as clinically indicated.  Original Report Authenticated By: Waynard Reeds, M.D.    PROCEDURES:  ED COURSE / COORDINATION OF CARE: Orders Placed This Encounter  Procedures  . Urine culture  . DG Chest Portable 1 View  . CBC  . Basic metabolic panel  . Urinalysis with microscopic  . Urine microscopic-add on  . D-dimer, quantitative  . D-dimer, quantitative  . Consult to internal medicine   11:45AM-K is low at 2.6.  Will give po KCL.   12:39PM- Patient explained lab and imaging results. Patient states SOB is worse at this time although lungs are clear to auscultation with distant breath sounds. Chest x-ray shows emphysema and, as a result, current symptoms are thought to be due to a COPD exacerbation. Patient explained intent to administer steroids but patient reports having an allergy to Prednisone which causes palpitations so will administer Solumedrol. Informed of intent to admit. Patient agrees with plan set forth at this time.   1:10 PM [facility] will admit her to a telemetry bed to Team 2.  They request a D-dimer be ordered to screen for pulmonary embolism.   DIAGNOSIS: 1. COPD exacerbation      MEDICATIONS GIVEN IN THE E.D.  Medications  methimazole (TAPAZOLE) 5 MG tablet (not administered)  cholecalciferol (VITAMIN D) 1000 UNITS tablet (not administered)  meclizine (ANTIVERT) 25 MG tablet (not administered)  albuterol (PROVENTIL  HFA;VENTOLIN HFA) 108 (90 BASE) MCG/ACT inhaler (not administered)  Calcium Carbonate-Vitamin D (CALCIUM 600+D) 600-400 MG-UNIT per tablet (not administered)  albuterol (PROVENTIL,VENTOLIN) solution continuous neb (not administered)  albuterol (PROVENTIL) (5 MG/ML) 0.5% nebulizer solution (10 mg  Given 10/31/10 1145)  potassium chloride (KLOR-CON) packet 40 mEq (20 mEq Oral Given 10/31/10 1221)  methylPREDNISolone sodium succinate (SOLU-MEDROL) 125 MG injection 125 mg (125 mg Intravenous Given 10/31/10 1300)      I personally performed the services described in this documentation, which was scribed in my presence. The recorded information has been reviewed and considered.  Osvaldo Human, M.D.    Carleene Cooper III, MD 10/31/10 1311

## 2010-10-31 NOTE — Progress Notes (Signed)
Patient refuses VQ scan, RN and Radiology representative attempted to explain benefits of test. Patient continues to refuse. MD aware.

## 2010-11-01 ENCOUNTER — Inpatient Hospital Stay (HOSPITAL_COMMUNITY): Payer: Medicare Other

## 2010-11-01 ENCOUNTER — Other Ambulatory Visit (HOSPITAL_COMMUNITY): Payer: Medicare Other

## 2010-11-01 LAB — BASIC METABOLIC PANEL
BUN: 9 mg/dL (ref 6–23)
Calcium: 9.9 mg/dL (ref 8.4–10.5)
Glucose, Bld: 91 mg/dL (ref 70–99)
Potassium: 3.8 mEq/L (ref 3.5–5.1)
Sodium: 141 mEq/L (ref 135–145)

## 2010-11-01 LAB — TSH: TSH: 0.413 u[IU]/mL (ref 0.350–4.500)

## 2010-11-01 LAB — CBC
MCH: 27.8 pg (ref 26.0–34.0)
MCHC: 31.2 g/dL (ref 30.0–36.0)
Platelets: 168 10*3/uL (ref 150–400)
RBC: 3.78 MIL/uL — ABNORMAL LOW (ref 3.87–5.11)

## 2010-11-01 LAB — CARDIAC PANEL(CRET KIN+CKTOT+MB+TROPI)
CK, MB: 9.8 ng/mL (ref 0.3–4.0)
CK, MB: 10.4 ng/mL (ref 0.3–4.0)
Total CK: 136 U/L (ref 7–177)
Total CK: 154 U/L (ref 7–177)

## 2010-11-01 LAB — MRSA PCR SCREENING: MRSA by PCR: NEGATIVE

## 2010-11-01 LAB — BLOOD GAS, ARTERIAL
Acid-Base Excess: 14.6 mmol/L — ABNORMAL HIGH (ref 0.0–2.0)
O2 Saturation: 98 %
TCO2: 36.8 mmol/L (ref 0–100)
pCO2 arterial: 65.4 mmHg (ref 35.0–45.0)
pO2, Arterial: 94.6 mmHg (ref 80.0–100.0)

## 2010-11-01 MED ORDER — ENOXAPARIN SODIUM 30 MG/0.3ML ~~LOC~~ SOLN: 1.0000 mg/kg | Freq: Two times a day (BID) | SUBCUTANEOUS | Status: DC

## 2010-11-01 MED ORDER — PNEUMOCOCCAL VAC POLYVALENT 25 MCG/0.5ML IJ INJ
0.5000 mL | INJECTION | INTRAMUSCULAR | Status: AC
  Filled 2010-11-01: qty 0.5

## 2010-11-01 MED ORDER — ALPRAZOLAM 0.5 MG PO TABS
0.5000 mg | ORAL_TABLET | Freq: Once | ORAL | Status: AC
  Administered 2010-11-01: 0.5 mg via ORAL
  Filled 2010-11-01: qty 1

## 2010-11-01 MED ORDER — ENOXAPARIN SODIUM 60 MG/0.6ML ~~LOC~~ SOLN
1.5000 mg/kg | SUBCUTANEOUS | Status: DC
  Administered 2010-11-01 – 2010-11-04 (×4): 55 mg via SUBCUTANEOUS
  Filled 2010-11-01 (×4): qty 0.6

## 2010-11-01 NOTE — Progress Notes (Signed)
CRITICAL VALUE ALERT  Critical value received:  CO2 40  Date of notification:  11/01/2010  Time of notification:  0730  Critical value read back:yes  Nurse who received alert:  Aline August RN  MD notified (1st page):  Rai  Time of first page:  0730  MD notified (2nd page):  Time of second page:  Responding MD:  Isidoro Donning  Time MD responded:  0730

## 2010-11-01 NOTE — Progress Notes (Signed)
Subjective: S: Still feeling short of breath but alert and oriented x3, denies any chest pain or any coughing. Patient refused VQ scan yesterday. Called by RN for hypercarbia, stat ABG reveals respiratory acidosis (appears appropriately compensated as pt is also a chronic CO2 retainer )  Objective: Vitals:  Temp: 97.3, hr 98, rr 20, bp 113/75 Weight change:   Intake/Output Summary (Last 24 hours) at 11/01/10 1044 Last data filed at 10/31/10 1700  Gross per 24 hour  Intake    200 ml  Output      0 ml  Net    200 ml    Physical Exam: General: Alert and awake oriented x3 not in any acute distress. HEENT: anicteric sclera, pupils reactive to light and accommodation CVS: S1-S2 clear no murmur rubs or gallops Chest: Diminished breath sounds throughout, poor inspiratory effort Abdomen: soft nontender, nondistended, normal bowel sounds, no organomegaly Extremities: no cyanosis, clubbing or edema noted bilaterally Neuro: Cranial nerves II-XII intact, no focal neurological deficits  Lab Results:  Basename 11/01/10 0605 10/31/10 2146 10/31/10 1033  NA 141 -- 144  K 3.8 3.7 --  CL 99 -- 93*  CO2 40* -- 45*  GLUCOSE 91 -- 106*  BUN 9 -- 7  CREATININE <0.47* -- <0.47*  CALCIUM 9.9 -- 9.9  MG -- -- --  PHOS -- -- --   No results found for this basename: AST:2,ALT:2,ALKPHOS:2,BILITOT:2,PROT:2,ALBUMIN:2 in the last 72 hours No results found for this basename: LIPASE:2,AMYLASE:2 in the last 72 hours  Basename 11/01/10 0605 10/31/10 1033  WBC 9.0 10.8*  NEUTROABS -- --  HGB 10.5* 11.7*  HCT 33.7* 38.0  MCV 89.2 90.7  PLT 168 173    Basename 11/01/10 0605 11/01/10 0103 10/31/10 1745  CKTOTAL 154 136 110  CKMB 10.4* 9.8* 7.8*  CKMBINDEX -- -- --  TROPONINI <0.30 <0.30 <0.30   No results found for this basename: POCBNP:3 in the last 72 hours  Basename 10/31/10 1033  DDIMER 1.09*   No results found for this basename: HGBA1C:2 in the last 72 hours No results found for this  basename: CHOL:2,HDL:2,LDLCALC:2,TRIG:2,CHOLHDL:2,LDLDIRECT:2 in the last 72 hours  Basename 10/31/10 1745  TSH 0.413  T4TOTAL --  T3FREE --  THYROIDAB --   No results found for this basename: VITAMINB12:2,FOLATE:2,FERRITIN:2,TIBC:2,IRON:2,RETICCTPCT:2 in the last 72 hours  Micro Results: No results found for this or any previous visit (from the past 240 hour(s)).  Studies/Results: Dg Chest Portable 1 View  10/31/2010  *RADIOLOGY REPORT*  Clinical Data: History of COPD, asthma, now with shortness of breath  PORTABLE CHEST - 1 VIEW  Comparison: 02/25/2007; 02/01/2007; 08/14/2006; chest CT - 02/01/2007  Findings: Unchanged cardiac silhouette and mediastinal contours with mild tortuosity of the descending thoracic aorta.  The lungs remain hyperinflated with flattening of the bilateral hemidiaphragms.  No new focal parenchymal opacities.  No definite pleural effusion or pneumothorax.  Unchanged bones.  IMPRESSION: Hyperinflated lungs without acute cardiopulmonary disease.  Further evaluation may be performed with a PA and lateral chest radiograph as clinically indicated.  Original Report Authenticated By: Waynard Reeds, M.D.   Medications: Scheduled Meds:   . albuterol      . azithromycin  500 mg Intravenous Q24H  . calcium-vitamin D  1 tablet Oral Daily  . cefTRIAXone (ROCEPHIN) IV  1 g Intravenous Q24H  . cholecalciferol  1,000 Units Oral Daily  . enoxaparin  40 mg Subcutaneous Q24H  . Fluticasone-Salmeterol  1 puff Inhalation BID  . guaiFENesin  600 mg Oral  BID  . ipratropium  0.5 mg Nebulization Q6H  . levalbuterol  0.63 mg Nebulization Q6H  . methimazole  2.5 mg Oral QODAY  . methylPREDNISolone sodium succinate  125 mg Intravenous Once  . potassium chloride  40 mEq Oral Once  . potassium chloride  10 mEq Intravenous Q1 Hr x 3  . potassium chloride  40 mEq Oral Once  . sodium chloride  3 mL Intravenous Q12H   Continuous Infusions:   . sodium chloride 250 mL (10/31/10 1607)    . DISCONTD: albuterol     PRN Meds:.acetaminophen, acetaminophen, alum & mag hydroxide-simeth, antiseptic oral rinse, guaiFENesin-dextromethorphan, HYDROcodone-acetaminophen, levalbuterol, meclizine, sodium chloride  Assessment/Plan: Principal Problem:  1) Respiratory failure, hypoxic and respiratory acidosis/ COPD exacerbation / oxygen dependent 2-3 L  - Stat ABG revealed PCO2 of 65.4, patient is alert and awake and oriented, appears to be appropriately compensated but does not feel better, still tight.  - Will observe in SDU for close monitoring, if BiPAP needed. - D-dimer was elevated, but patient refused V/Q scan to rule out any pulmonary embolism, will place on full dose lovenox until more more stable and PE is ruled out by the VQ scan.   - Continue Xopenex and ipratropium nebulizer treatment scheduled and PRN.  -  Continue oxygen supplementation and Advair diskus. Continue IV Zithromax and Rocephin, Mucinex and Robitussin - hold off on IV Solu-Medrol or by mouth prednisone, given patient's concern about the allergy and palpitations, in ED HR in 100's-140's after albuterol and solumedrol in ED. - - Will likely need home O2 evaluation at the time of discharge.  - Will benefit from Pulmonology consultation (Dr Juanetta Gosling will be available on Monday).   .Tachycardia: improved, likely secondary to multiple albuterol nebulizer treatments in the emergency room - place on full dose lovenox until PE is ruled out.   .Thyroid disorder:  TSH 0.43 and continue methimazole   .Hypokalemia: resolved  Prophylaxis: Lovenox for DVT prophylaxis   CODE STATUS full code per patient's wishes    LOS: 1 day   RAI,RIPUDEEP 11/01/2010, 10:44 AM

## 2010-11-01 NOTE — Progress Notes (Addendum)
ANTICOAGULATION CONSULT NOTE - Initial Consult  Pharmacy Consult for Lovenox Indication: R/O pulmonary embolus  Allergies  Allergen Reactions  . Food Shortness Of Breath    ONIONS  . Promist La (Pseudoephedrine-Guaifenesin) Shortness Of Breath  . Sulfa Antibiotics Nausea And Vomiting  . Aspirin Other (See Comments)    "FELT AS IF SHE WOULD DIE"  . Iodine Other (See Comments)    FELT "BAD" EXAUSTED   . Latex Itching    PLASTIC BANDAIDS  . Prednisone Palpitations    SPEEDS HEART RATE    Patient Measurements: Height: 5\' 2"  (157.5 cm) Weight: 79 lb 3.2 oz (35.925 kg) IBW/kg (Calculated) : 50.1    Vital Signs: Temp: 97.8 F (36.6 C) (09/29 0600) Temp src: Oral (09/29 0600) BP: 127/70 mmHg (09/29 1109) Pulse Rate: 98  (09/29 0809)  Labs:  Alvira Philips 11/01/10 0605 11/01/10 0103 10/31/10 1745 10/31/10 1033  HGB 10.5* -- -- 11.7*  HCT 33.7* -- -- 38.0  PLT 168 -- -- 173  APTT -- -- -- --  LABPROT -- -- -- --  INR -- -- -- --  HEPARINUNFRC -- -- -- --  CREATININE <0.47* -- -- <0.47*  CRCLEARANCE -- -- -- --  CKTOTAL 154 136 110 --  CKMB 10.4* 9.8* 7.8* --  TROPONINI <0.30 <0.30 <0.30 --    Medical History: Past Medical History  Diagnosis Date  . Arthritis   . Asthma   . COPD (chronic obstructive pulmonary disease)   . Thyroid disorder 10/31/2010    Medications:  Scheduled:    . albuterol      . azithromycin  500 mg Intravenous Q24H  . calcium-vitamin D  1 tablet Oral Daily  . cefTRIAXone (ROCEPHIN) IV  1 g Intravenous Q24H  . cholecalciferol  1,000 Units Oral Daily  . enoxaparin  40 mg Subcutaneous Q24H  . Fluticasone-Salmeterol  1 puff Inhalation BID  . guaiFENesin  600 mg Oral BID  . ipratropium  0.5 mg Nebulization Q6H  . levalbuterol  0.63 mg Nebulization Q6H  . methimazole  2.5 mg Oral QODAY  . methylPREDNISolone sodium succinate  125 mg Intravenous Once  . pneumococcal 23 valent vaccine  0.5 mL Intramuscular Tomorrow-1000  . potassium chloride  40  mEq Oral Once  . potassium chloride  10 mEq Intravenous Q1 Hr x 3  . potassium chloride  40 mEq Oral Once  . sodium chloride  3 mL Intravenous Q12H    Assessment: Okay for Protocol Estimated CrCl = 25ml/min Goal of Therapy:  Appropriate Dosing   Plan:  Lovenox treatment dose 1.5mg ./kg every 24 hours.  Check CBC every 3 days. Check INR in AM.   Mady Gemma 11/01/2010,11:37 AM

## 2010-11-01 NOTE — Progress Notes (Signed)
MD returned call on Critical value at 1934, No new orders at this time.

## 2010-11-02 ENCOUNTER — Inpatient Hospital Stay (HOSPITAL_COMMUNITY): Payer: Medicare Other

## 2010-11-02 LAB — BLOOD GAS, ARTERIAL
Acid-Base Excess: 11.5 mmol/L — ABNORMAL HIGH (ref 0.0–2.0)
PEEP: 5 cmH2O
Patient temperature: 37
Pressure control: 15 cmH2O
TCO2: 36.7 mmol/L (ref 0–100)
pCO2 arterial: 63.7 mmHg (ref 35.0–45.0)
pH, Arterial: 7.233 — ABNORMAL LOW (ref 7.350–7.400)
pO2, Arterial: 92.5 mmHg (ref 80.0–100.0)

## 2010-11-02 LAB — URINE CULTURE
Colony Count: 9000
Culture  Setup Time: 201209290058

## 2010-11-02 LAB — CBC
Hemoglobin: 11.9 g/dL — ABNORMAL LOW (ref 12.0–15.0)
MCHC: 30.5 g/dL (ref 30.0–36.0)
RDW: 14.7 % (ref 11.5–15.5)
WBC: 9.5 10*3/uL (ref 4.0–10.5)

## 2010-11-02 LAB — BASIC METABOLIC PANEL
BUN: 9 mg/dL (ref 6–23)
Potassium: 4 mEq/L (ref 3.5–5.1)
Sodium: 142 mEq/L (ref 135–145)

## 2010-11-02 LAB — PROTIME-INR
INR: 0.92 (ref 0.00–1.49)
Prothrombin Time: 12.6 seconds (ref 11.6–15.2)

## 2010-11-02 MED ORDER — LORAZEPAM 2 MG/ML IJ SOLN
0.2500 mg | Freq: Two times a day (BID) | INTRAMUSCULAR | Status: DC | PRN
  Administered 2010-11-02 – 2010-11-03 (×2): 0.26 mg via INTRAVENOUS
  Filled 2010-11-02 (×2): qty 1

## 2010-11-02 MED ORDER — PANTOPRAZOLE SODIUM 40 MG PO TBEC
40.0000 mg | DELAYED_RELEASE_TABLET | Freq: Every day | ORAL | Status: DC
  Administered 2010-11-03 – 2010-11-08 (×6): 40 mg via ORAL
  Filled 2010-11-02 (×6): qty 1

## 2010-11-02 MED ORDER — METHYLPREDNISOLONE SODIUM SUCC 125 MG IJ SOLR
60.0000 mg | Freq: Three times a day (TID) | INTRAMUSCULAR | Status: DC
  Administered 2010-11-02 – 2010-11-07 (×17): 60 mg via INTRAVENOUS
  Filled 2010-11-02 (×17): qty 2

## 2010-11-02 MED ORDER — SODIUM CHLORIDE 0.9 % IJ SOLN
INTRAMUSCULAR | Status: AC
  Filled 2010-11-02: qty 30

## 2010-11-02 MED ORDER — LORAZEPAM 2 MG/ML IJ SOLN
0.2500 mg | Freq: Once | INTRAMUSCULAR | Status: AC
  Administered 2010-11-02: 0.26 mg via INTRAVENOUS
  Filled 2010-11-02: qty 1

## 2010-11-02 MED ORDER — SODIUM CHLORIDE 0.9 % IJ SOLN
INTRAMUSCULAR | Status: AC
  Administered 2010-11-02
  Filled 2010-11-02: qty 3

## 2010-11-02 NOTE — Progress Notes (Signed)
ABG results called to MD, new orders written. Will continue to monitor.

## 2010-11-02 NOTE — Progress Notes (Signed)
ANTICOAGULATION CONSULT NOTE Pharmacy Consult for Lovenox Indication: R/O pulmonary embolus  Allergies  Allergen Reactions  . Food Shortness Of Breath    ONIONS  . Promist La (Pseudoephedrine-Guaifenesin) Shortness Of Breath  . Sulfa Antibiotics Nausea And Vomiting  . Aspirin Other (See Comments)    "FELT AS IF SHE WOULD DIE"  . Iodine Other (See Comments)    FELT "BAD" EXAUSTED   . Latex Itching    PLASTIC BANDAIDS  . Prednisone Palpitations    SPEEDS HEART RATE    Patient Measurements: Height: 5\' 2"  (157.5 cm) Weight: 79 lb 3.2 oz (35.925 kg) IBW/kg (Calculated) : 50.1    Vital Signs: Temp: 98.1 F (36.7 C) (09/30 0448) BP: 126/8 mmHg (09/30 0448) Pulse Rate: 80  (09/30 0448)  Labs:  Basename 11/02/10 0505 11/01/10 0605 11/01/10 0103 10/31/10 1745 10/31/10 1033  HGB 11.9* 10.5* -- -- --  HCT 39.0 33.7* -- -- 38.0  PLT 212 168 -- -- 173  APTT -- -- -- -- --  LABPROT 12.6 -- -- -- --  INR 0.92 -- -- -- --  HEPARINUNFRC -- -- -- -- --  CREATININE <0.47* <0.47* -- -- <0.47*  CRCLEARANCE -- -- -- -- --  CKTOTAL -- 154 136 110 --  CKMB -- 10.4* 9.8* 7.8* --  TROPONINI -- <0.30 <0.30 <0.30 --   CrCl cannot be calculated (Patient has no sCr result on file.).   Medical History: Past Medical History  Diagnosis Date  . Arthritis   . Asthma   . COPD (chronic obstructive pulmonary disease)   . Thyroid disorder 10/31/2010    Medications:  Scheduled:     . ALPRAZolam  0.5 mg Oral Once  . azithromycin  500 mg Intravenous Q24H  . calcium-vitamin D  1 tablet Oral Daily  . cefTRIAXone (ROCEPHIN) IV  1 g Intravenous Q24H  . cholecalciferol  1,000 Units Oral Daily  . enoxaparin (LOVENOX) injection  1.5 mg/kg Subcutaneous Q24H  . Fluticasone-Salmeterol  1 puff Inhalation BID  . guaiFENesin  600 mg Oral BID  . ipratropium  0.5 mg Nebulization Q6H  . levalbuterol  0.63 mg Nebulization Q6H  . LORazepam  0.26 mg Intravenous Once  . methimazole  2.5 mg Oral QODAY  .  methylPREDNISolone sodium succinate  60 mg Intravenous Q8H  . pneumococcal 23 valent vaccine  0.5 mL Intramuscular Tomorrow-1000  . sodium chloride  3 mL Intravenous Q12H  . DISCONTD: enoxaparin  1 mg/kg Subcutaneous Q12H  . DISCONTD: enoxaparin  40 mg Subcutaneous Q24H    Assessment: Okay for Protocol Estimated CrCl = 62ml/min Awaiting PE confirmation via diagnostic method.  Goal of Therapy:  Appropriate Dosing   Plan:  Lovenox treatment dose 1.5mg ./kg every 24 hours. CBC every 3 days. Lamonte Richer R 11/02/2010,9:35 AM

## 2010-11-02 NOTE — Progress Notes (Signed)
Subjective: 1 Zocor per nursing that patient was in arrest artery distress A. ABG was ordered as well as a stat chest x-ray was pulled back per nursing that patient's ABG was back with a  pH of 7.23 and a PCO2 of 96 I asked that patient be transferred to the ICU and placed on the BiPAP patient is now in the ICU room and patient  is using the accessory muscles of respiration and  in respiratory distress.  Objective: Vital signs in last 24 hours: Filed Vitals:   11/01/10 2058 11/02/10 0136 11/02/10 0448 11/02/10 0802  BP: 129/86  126/8   Pulse: 136  80   Temp: 97.9 F (36.6 C)  98.1 F (36.7 C)   TempSrc:      Resp: 22  22   Height:      Weight:      SpO2: 96% 96%  95%    Intake/Output Summary (Last 24 hours) at 11/02/10 1053 Last data filed at 11/01/10 1200  Gross per 24 hour  Intake    120 ml  Output    200 ml  Net    -80 ml    Weight change:   General: Alert, awake, patient is using accessory muscles of respiration. HEENT: No bruits, no goiter. Heart: Tachycardic regular rhythm. Lungs: Poor air movement, tight no wheezes appreciated. Abdomen: Soft, nontender, nondistended, positive bowel sounds. Extremities: No clubbing cyanosis or edema with positive pedal pulses. Neuro: Grossly intact, nonfocal.    Lab Results: Results for orders placed during the hospital encounter of 10/31/10 (from the past 24 hour(s))  MRSA PCR SCREENING     Status: Normal   Collection Time   11/01/10 11:10 AM      Component Value Range   MRSA by PCR NEGATIVE  NEGATIVE   BASIC METABOLIC PANEL     Status: Abnormal   Collection Time   11/02/10  5:05 AM      Component Value Range   Sodium 142  135 - 145 (mEq/L)   Potassium 4.0  3.5 - 5.1 (mEq/L)   Chloride 98  96 - 112 (mEq/L)   CO2 42 (*) 19 - 32 (mEq/L)   Glucose, Bld 72  70 - 99 (mg/dL)   BUN 9  6 - 23 (mg/dL)   Creatinine, Ser <1.61 (*) 0.50 - 1.10 (mg/dL)   Calcium 9.8  8.4 - 09.6 (mg/dL)   GFR calc non Af Amer NOT CALCULATED  >60  (mL/min)   GFR calc Af Amer NOT CALCULATED  >60 (mL/min)  CBC     Status: Abnormal   Collection Time   11/02/10  5:05 AM      Component Value Range   WBC 9.5  4.0 - 10.5 (K/uL)   RBC 4.26  3.87 - 5.11 (MIL/uL)   Hemoglobin 11.9 (*) 12.0 - 15.0 (g/dL)   HCT 04.5  40.9 - 81.1 (%)   MCV 91.5  78.0 - 100.0 (fL)   MCH 27.9  26.0 - 34.0 (pg)   MCHC 30.5  30.0 - 36.0 (g/dL)   RDW 91.4  78.2 - 95.6 (%)   Platelets 212  150 - 400 (K/uL)  PROTIME-INR     Status: Normal   Collection Time   11/02/10  5:05 AM      Component Value Range   Prothrombin Time 12.6  11.6 - 15.2 (seconds)   INR 0.92  0.00 - 1.49   BLOOD GAS, ARTERIAL     Status: Abnormal   Collection Time  11/02/10  9:02 AM      Component Value Range   O2 Content 2.0     Delivery systems NASAL CANNULA     pH, Arterial 7.233 (*) 7.350 - 7.400    pCO2 arterial 96.2 (*) 35.0 - 45.0 (mmHg)   pO2, Arterial 69.1 (*) 80.0 - 100.0 (mmHg)   Bicarbonate 39.1 (*) 20.0 - 24.0 (mEq/L)   TCO2 36.7  0 - 100 (mmol/L)   Acid-Base Excess 11.5 (*) 0.0 - 2.0 (mmol/L)   O2 Saturation 92.5     Patient temperature 37.0     Collection site RIGHT RADIAL     Drawn by [phone]     Sample type ARTERIAL     Allens test (pass/fail) PASS  PASS      Micro: Recent Results (from the past 240 hour(s))  MRSA PCR SCREENING     Status: Normal   Collection Time   11/01/10 11:10 AM      Component Value Range Status Comment   MRSA by PCR NEGATIVE  NEGATIVE  Final     Studies/Results: Dg Chest 1 View  11/02/2010  *RADIOLOGY REPORT*  Clinical Data: Short of breath.  COPD.  CHEST - 1 VIEW  Comparison: 11/01/2010  Findings: Severe pulmonary emphysema is again demonstrated.  No evidence of acute infiltrate or pleural effusion.  Mild left basilar scarring and old left rib fracture deformities are again seen.  Heart size is normal.  IMPRESSION: Severe pulmonary emphysema.  No acute findings.  Original Report Authenticated By: Danae Orleans, M.D.   Dg Chest Portable 1  View  11/01/2010  *RADIOLOGY REPORT*  Clinical Data: 75 year old female with chest pain and shortness of breath.  PORTABLE CHEST - 1 VIEW  Comparison: 10/31/2010 and earlier.  Findings: Portable AP upright view 2100 hours.  Chronic pulmonary hyperinflation.  Cardiac size and mediastinal contours are within normal limits.  Visualized tracheal air column is within normal limits.  No pneumothorax, pulmonary edema, definite pleural effusion, or acute pulmonary opacity.  IMPRESSION: Chronic pulmonary hyperinflation. No superimposed acute findings are identified.  Original Report Authenticated By: Harley Hallmark, M.D.    Medications:    . ALPRAZolam  0.5 mg Oral Once  . azithromycin  500 mg Intravenous Q24H  . calcium-vitamin D  1 tablet Oral Daily  . cefTRIAXone (ROCEPHIN) IV  1 g Intravenous Q24H  . cholecalciferol  1,000 Units Oral Daily  . enoxaparin (LOVENOX) injection  1.5 mg/kg Subcutaneous Q24H  . Fluticasone-Salmeterol  1 puff Inhalation BID  . guaiFENesin  600 mg Oral BID  . ipratropium  0.5 mg Nebulization Q6H  . levalbuterol  0.63 mg Nebulization Q6H  . LORazepam  0.26 mg Intravenous Once  . methimazole  2.5 mg Oral QODAY  . methylPREDNISolone sodium succinate  60 mg Intravenous Q8H  . pneumococcal 23 valent vaccine  0.5 mL Intramuscular Tomorrow-1000  . sodium chloride  3 mL Intravenous Q12H  . DISCONTD: enoxaparin  1 mg/kg Subcutaneous Q12H  . DISCONTD: enoxaparin  40 mg Subcutaneous Q24H     Assessment/Plan Principal Problem:  *Respiratory failure, acute and chronic Active Problems:  COPD exacerbation  Tachycardia  Thyroid disorder  Hypoxia  Hypokalemia    #1 acute on chronic respiratory failure- rate 2 acute COPD exacerbation .Patient is using accessory  muscles of respiration her lung sounds are very tight with very poor air movement patient is however alert and following commands. ABG shows patient is in the respiratory acidosis with a pH of 7.23 with  a PCO2 of 96  patient has been transferred to the ICU bed we will place patient on a BiPAP 12/6 per RT protocol we'll repeat the ABG in 2 hours and follow up if no significant improvement we'll transfer patient to [facility] under critical care medicine and have spoken with Dr. Sung Amabile. Will place patient on Solu-Medrol 60 mg IV q. 8 hours, continue empiric antibiotics of azithromycin and Rocephin continue nebulizer treatments, mucinex and follow. In a BiPAP 12/6 per RT protocol monitor closely. #2. Acute COPD exacerbation please see problem #1 3. sinus tachycardia secondary to problem #1 follow #4 is hypokalemia resolved #5 thyroid disorder- TSH was 0.413 continue home regimen of methimazole. #6 prophylaxis-Lovenox for DVT prophylaxis we'll place on Protonix for for GI prophylaxis.    LOS: 2 days   [facility] 11/02/2010, 10:53 AM

## 2010-11-03 ENCOUNTER — Encounter (HOSPITAL_COMMUNITY): Payer: Medicare Other

## 2010-11-03 ENCOUNTER — Inpatient Hospital Stay (HOSPITAL_COMMUNITY): Payer: Medicare Other

## 2010-11-03 LAB — CBC
Hemoglobin: 10.6 g/dL — ABNORMAL LOW (ref 12.0–15.0)
MCHC: 30.7 g/dL (ref 30.0–36.0)
RBC: 3.78 MIL/uL — ABNORMAL LOW (ref 3.87–5.11)

## 2010-11-03 LAB — BASIC METABOLIC PANEL
Potassium: 3.8 mEq/L (ref 3.5–5.1)
Sodium: 139 mEq/L (ref 135–145)

## 2010-11-03 MED ORDER — PRO-STAT SUGAR FREE PO LIQD
30.0000 mL | Freq: Three times a day (TID) | ORAL | Status: DC
  Administered 2010-11-06 – 2010-11-08 (×4): 30 mL via ORAL
  Filled 2010-11-03 (×4): qty 30

## 2010-11-03 MED ORDER — PRO-STAT SUGAR FREE PO LIQD
30.0000 mL | Freq: Three times a day (TID) | ORAL | Status: DC
  Administered 2010-11-03 – 2010-11-07 (×9): 30 mL via ORAL
  Filled 2010-11-03 (×4): qty 30
  Filled 2010-11-03: qty 60
  Filled 2010-11-03: qty 30

## 2010-11-03 NOTE — Progress Notes (Addendum)
INITIAL ADULT NUTRITION ASSESSMENT Date: 11/03/2010   Time: 1:12 PM Reason for Assessment: Pt appears severely malnourished  ASSESSMENT: Female 75 y.o.  Dx: Respiratory failure, acute and chronic   Past Medical History  Diagnosis Date  . Arthritis   . Asthma   . COPD (chronic obstructive pulmonary disease)   . Thyroid disorder 10/31/2010   Related Meds:Ht: 5\' 2"  (157.5 cm)  Wt: 84 lb 7 oz (38.3 kg)  Ideal Wt: 50.1 kg 54.6 kg % Ideal Wt: 85%  Usual Wt: 90-95# % Usual Wt: 94%  Body mass index is 15.44 kg/(m^2).  Food/Nutrition Related Hx: Pt awake on BiPap. Respiratory therapy and nurse present. Pt has been very short of breath and unable to eat 0% po since admission. BiPap removed for trial of po at lunch but was only able to take pills no food. She is endentulous and her dentures are at home. Rec Mech Soft diet to promote decr work of chewing. Limited hx provided due to shortness of breath. Pt unable to participate in diet edu at this time.  She reports wt used to be in the 90's but is unable to recall when that was. I suspect severe malnutrition given her temporal wasting and inadequate oral intake r/t respiratory failure and incr energy expenditure due to incr work of breathing. Pt has allergy to iodine-therefore Ensure and Resource Breeze are contraindicated. Will add ProStat 30 ml TID and milk with meals. If pt unable to incr po's rec TF be initiated.    BMET    Component Value Date/Time   NA 139 11/03/2010 0450   K 3.8 11/03/2010 0450   CL 95* 11/03/2010 0450   CO2 38* 11/03/2010 0450   GLUCOSE 104* 11/03/2010 0450   BUN 19 11/03/2010 0450   CREATININE <0.47* 11/03/2010 0450   CALCIUM 9.5 11/03/2010 0450   GFRNONAA NOT CALCULATED 11/03/2010 0450   GFRAA NOT CALCULATED 11/03/2010 0450   CBC    Component Value Date/Time   WBC 6.2 11/03/2010 0450   RBC 3.78* 11/03/2010 0450   HGB 10.6* 11/03/2010 0450   HCT 34.5* 11/03/2010 0450   PLT 217 11/03/2010 0450   MCV 91.3 11/03/2010  0450   MCH 28.0 11/03/2010 0450   MCHC 30.7 11/03/2010 0450   RDW 14.2 11/03/2010 0450   LYMPHSABS 1.6 02/25/2007 1205   MONOABS 0.4 02/25/2007 1205   EOSABS 0.2 02/25/2007 1205   BASOSABS 0.1 02/25/2007 1205   CBG (last 3)  No results found for this basename: GLUCAP:3 in the last 72 hours   Intake/Output Summary (Last 24 hours) at 11/03/10 1325 Last data filed at 11/03/10 1022  Gross per 24 hour  Intake    483 ml  Output      0 ml  Net    483 ml    Diet Order: Cardiac-Heart Healthy (po 0%)  Supplements/Tube Feeding:none at this time  IVF:    Estimated Nutritional Needs:   ZOXW:[id number] Protein:65-75 grams Fluid:1.4-1.6 L/d  NUTRITION DIAGNOSIS: -Inadequate oral intake (NI-2.1).  Status: Ongoing  RELATED TO:  -respiratory failure  AS EVIDENCE BY:  -meal intake 0%, muscle loss observed  MONITORING/EVALUATION(Goals): -Pt will meet >75% of est energy and protein needs. -She will maintain wt without incr in fluid accumulation.  -Monitor po's, labs and wt changes.  EDUCATION NEEDS: -Education not appropriate at this time. Will address with pt when she is more able to participate.  INTERVENTION: -Rec Downgrade diet to Mech Soft Heart Healthy.  -Consider Liberalizing diet if feasible  to Regular to potentially improve acceptability. -Add milk with all meals (110 kcal, 8 gr protein) each 8 oz -Add ProStat 30 ml TID (216 kcal, 45 grams protein) per day. -If pt unable to incr po's rec initiate continuous  TF via NG of Osm 1.5 start at 20ml adv 10 ml q 4 hr to goal of 40 ml/hr ( at goal- provides 1440 kcal, 60 gr protein, 732 ml water).  Dietitian [phone]  DOCUMENTATION CODES Per approved criteria  -Severe malnutrition in the context of acute and chronic illness or injury    Francene Boyers 11/03/2010, 1:12 PM

## 2010-11-03 NOTE — Progress Notes (Addendum)
Subjective: Patient on BiPAP overnight. Repeat ABG post BiPAP on 11/02/2010 the pH of 7.36 PCO2 of 64 PO2 of 192.5. Respiratory therapy just took off patient's BiPAP patient is breathing much easier patient states that she's feeling better today patient is speaking in full sentences.  Objective: Vital signs in last 24 hours: Filed Vitals:   11/03/10 0300 11/03/10 0326 11/03/10 0400 11/03/10 0500  BP: 142/102 122/75 98/54   Pulse: 131 111 107   Temp:   98 F (36.7 C)   TempSrc:   Axillary   Resp: 24 29 19    Height:      Weight:    38.3 kg (84 lb 7 oz)  SpO2: 99% 99% 96%     Intake/Output Summary (Last 24 hours) at 11/03/10 0737 Last data filed at 11/03/10 0600  Gross per 24 hour  Intake      0 ml  Output      0 ml  Net      0 ml    Weight change:   General: Alert, awake, oriented x3, decreased use of accessory muscles of respiration patient speaking in full sentences. HEENT: No bruits, no goiter. Heart: Regular rate and rhythm, without murmurs, rubs, gallops. Lungs: Air movement is improved from yesterday less tight no wheezing no crackles. Abdomen: Soft, nontender, nondistended, positive bowel sounds. Extremities: No clubbing cyanosis or edema with positive pedal pulses. Neuro: Grossly intact, nonfocal.    Lab Results: Results for orders placed during the hospital encounter of 10/31/10 (from the past 24 hour(s))  BLOOD GAS, ARTERIAL     Status: Abnormal   Collection Time   11/02/10  9:02 AM      Component Value Range   O2 Content 2.0     Delivery systems NASAL CANNULA     pH, Arterial 7.233 (*) 7.350 - 7.400    pCO2 arterial 96.2 (*) 35.0 - 45.0 (mmHg)   pO2, Arterial 69.1 (*) 80.0 - 100.0 (mmHg)   Bicarbonate 39.1 (*) 20.0 - 24.0 (mEq/L)   TCO2 36.7  0 - 100 (mmol/L)   Acid-Base Excess 11.5 (*) 0.0 - 2.0 (mmol/L)   O2 Saturation 92.5     Patient temperature 37.0     Collection site RIGHT RADIAL     Drawn by [phone]     Sample type ARTERIAL     Allens test  (pass/fail) PASS  PASS   BLOOD GAS, ARTERIAL     Status: Abnormal   Collection Time   11/02/10  1:25 PM      Component Value Range   FIO2 35.00     Delivery systems BILEVEL POSITIVE AIRWAY PRESSURE     Rate 15     Peep/cpap 5.0     Pressure control 15     pH, Arterial 7.366  7.350 - 7.400    pCO2 arterial 63.7 (*) 35.0 - 45.0 (mmHg)   pO2, Arterial 92.5  80.0 - 100.0 (mmHg)   Bicarbonate 35.6 (*) 20.0 - 24.0 (mEq/L)   TCO2 32.6  0 - 100 (mmol/L)   Acid-Base Excess 10.0 (*) 0.0 - 2.0 (mmol/L)   O2 Saturation 98.3     Patient temperature 37.0     Collection site RIGHT RADIAL     Drawn by COLLECTED BY RT     Sample type ARTERIAL     Allens test (pass/fail) PASS  PASS   BASIC METABOLIC PANEL     Status: Abnormal   Collection Time   11/03/10  4:50 AM  Component Value Range   Sodium 139  135 - 145 (mEq/L)   Potassium 3.8  3.5 - 5.1 (mEq/L)   Chloride 95 (*) 96 - 112 (mEq/L)   CO2 38 (*) 19 - 32 (mEq/L)   Glucose, Bld 104 (*) 70 - 99 (mg/dL)   BUN 19  6 - 23 (mg/dL)   Creatinine, Ser <1.19 (*) 0.50 - 1.10 (mg/dL)   Calcium 9.5  8.4 - 14.7 (mg/dL)   GFR calc non Af Amer NOT CALCULATED  >90 (mL/min)   GFR calc Af Amer NOT CALCULATED  >90 (mL/min)  CBC     Status: Abnormal   Collection Time   11/03/10  4:50 AM      Component Value Range   WBC 6.2  4.0 - 10.5 (K/uL)   RBC 3.78 (*) 3.87 - 5.11 (MIL/uL)   Hemoglobin 10.6 (*) 12.0 - 15.0 (g/dL)   HCT 82.9 (*) 56.2 - 46.0 (%)   MCV 91.3  78.0 - 100.0 (fL)   MCH 28.0  26.0 - 34.0 (pg)   MCHC 30.7  30.0 - 36.0 (g/dL)   RDW 13.0  86.5 - 78.4 (%)   Platelets 217  150 - 400 (K/uL)     Micro: Recent Results (from the past 240 hour(s))  URINE CULTURE     Status: Normal   Collection Time   10/31/10 11:37 AM      Component Value Range Status Comment   Specimen Description URINE, CLEAN CATCH   Final    Special Requests NONE   Final    Setup Time [id number]   Final    Colony Count 9,000 COLONIES/ML   Final    Culture  INSIGNIFICANT GROWTH   Final    Report Status 11/02/2010 FINAL   Final   MRSA PCR SCREENING     Status: Normal   Collection Time   11/01/10 11:10 AM      Component Value Range Status Comment   MRSA by PCR NEGATIVE  NEGATIVE  Final     Studies/Results: Dg Chest 1 View  11/02/2010  *RADIOLOGY REPORT*  Clinical Data: Short of breath.  COPD.  CHEST - 1 VIEW  Comparison: 11/01/2010  Findings: Severe pulmonary emphysema is again demonstrated.  No evidence of acute infiltrate or pleural effusion.  Mild left basilar scarring and old left rib fracture deformities are again seen.  Heart size is normal.  IMPRESSION: Severe pulmonary emphysema.  No acute findings.  Original Report Authenticated By: Danae Orleans, M.D.   Dg Chest Portable 1 View  11/01/2010  *RADIOLOGY REPORT*  Clinical Data: 75 year old female with chest pain and shortness of breath.  PORTABLE CHEST - 1 VIEW  Comparison: 10/31/2010 and earlier.  Findings: Portable AP upright view 2100 hours.  Chronic pulmonary hyperinflation.  Cardiac size and mediastinal contours are within normal limits.  Visualized tracheal air column is within normal limits.  No pneumothorax, pulmonary edema, definite pleural effusion, or acute pulmonary opacity.  IMPRESSION: Chronic pulmonary hyperinflation. No superimposed acute findings are identified.  Original Report Authenticated By: Harley Hallmark, M.D.    Medications:    . azithromycin  500 mg Intravenous Q24H  . calcium-vitamin D  1 tablet Oral Daily  . cefTRIAXone (ROCEPHIN) IV  1 g Intravenous Q24H  . cholecalciferol  1,000 Units Oral Daily  . enoxaparin (LOVENOX) injection  1.5 mg/kg Subcutaneous Q24H  . Fluticasone-Salmeterol  1 puff Inhalation BID  . guaiFENesin  600 mg Oral BID  . ipratropium  0.5 mg  Nebulization Q6H  . levalbuterol  0.63 mg Nebulization Q6H  . LORazepam  0.26 mg Intravenous Once  . methimazole  2.5 mg Oral QODAY  . methylPREDNISolone sodium succinate  60 mg Intravenous Q8H  .  pantoprazole  40 mg Oral Q1200  . pneumococcal 23 valent vaccine  0.5 mL Intramuscular Tomorrow-1000  . sodium chloride  3 mL Intravenous Q12H  . sodium chloride      . sodium chloride         Assessment/Plan Principal Problem:  *Respiratory failure, acute and chronic Active Problems:  COPD exacerbation  Tachycardia  Thyroid disorder  Hypoxia  Hypokalemia   #1 acute on chronic respiratory failure- secondary to acute COPD exacerbation. Patients breathing has improved on the BiPAP patient is using less use of accessory muscles of respiration. Air movement is less tight with poor to fair air movement.  Patient is alert and following commands. Repeat ABG from yesterday after one to 2 hours on the BiPAP had that improved with a pH of 7.36 PCO2 of 64 PO2 of 92.5.  fo Will continue patient on Solu-Medrol 60 mg IV q. 8 hours, continue empiric antibiotics of azithromycin and Rocephin continue nebulizer treatments, mucinex and follow. BiPAP as needed. Consult with pulmonary for further evaluation and recommendations. #2. Acute COPD exacerbation please see problem #1  3. sinus tachycardia secondary to problem #1. Improved. #4 is hypokalemia resolved  #5 thyroid disorder- TSH was 0.413 continue home regimen of methimazole.  #6 prophylaxis-Lovenox for DVT prophylaxis we'll place on Protonix for for GI prophylaxis     LOS: 3 days   [facility] 11/03/2010, 7:37 AM    After a few minutes off the BiPAP patient started to have increased work of breathing and has been placed back on the BiPAP. Pulmonary consult is pending follow.

## 2010-11-03 NOTE — Consults (Signed)
Consult requested by: Dr. Janee Morn hospitalist attending Consult requested for respiratory failure:  HPI: This is a 75 year old Caucasian female with a long known history of COPD. She came to the hospital about 4 days ago with increasing shortness of breath cough and congestion. She denied any fever or chest pain. She says that normally she does fairly well at home and is able to manage her activities of daily living but she got much worse in the last several days. She was treated in the emergency room but unable to be sent home. She was admitted placed on treatment for her COPD and is now on BiPAP  Past Medical History  Diagnosis Date  . Arthritis   . Asthma   . COPD (chronic obstructive pulmonary disease)   . Thyroid disorder 10/31/2010     History reviewed. No pertinent family history. she says there is a family history of COPD but I am not certain of the extent.  History   Social History  . Marital Status: Widowed    Spouse Name: N/A    Number of Children: N/A  . Years of Education: N/A   Social History Main Topics  . Smoking status: Former Games developer  . Smokeless tobacco: None  . Alcohol Use: No  . Drug Use: No  . Sexually Active: No   Other Topics Concern  . None   Social History Narrative  . None   she typically lives at home alone but does have family members nearby who check on her on a daily basis she has about a 50-pack-year smoking history but stopped some 5 years ago  ROS: Except as mentioned is negative. She denies any chest pain ankle swelling    Objective: Vital signs in last 24 hours: Temp:  [96.7 F (35.9 C)-98.8 F (37.1 C)] 98 F (36.7 C) (10/01 0400) Pulse Rate:  [74-131] 107  (10/01 0400) Resp:  [14-29] 26  (10/01 0756) BP: (94-142)/(54-119) 98/54 mmHg (10/01 0400) SpO2:  [92 %-100 %] 100 % (10/01 0756) FiO2 (%):  [28.7 %-38.1 %] 32.1 % (10/01 0200) Weight:  [38.3 kg (84 lb 7 oz)] 84 lb 7 oz (38.3 kg) (10/01 0500) Weight change:  Last BM Date:  11/01/10  Intake/Output from previous day:    PHYSICAL EXAM She is very thin. She is on BiPAP and it makes it difficult to understand her speech but she is speaking in sentences. She is somewhat hard of hearing. Her HEENT examination shows an her pupils are reactive mucous membranes are moist and her neck is supple without masses bruits or JVD. Her chest shows decreased breath sounds and rhonchi bilaterally. Her heart is regular without murmur gallop or rub. Her abdomen is soft without masses. Her extremities showed no edema. Her central nervous system examination is grossly intact  Lab Results: Basic Metabolic Panel:  Basename 11/03/10 0450 11/02/10 0505  NA 139 142  K 3.8 4.0  CL 95* 98  CO2 38* 42*  GLUCOSE 104* 72  BUN 19 9  CREATININE <0.47* <0.47*  CALCIUM 9.5 9.8  MG -- --  PHOS -- --   Liver Function Tests: No results found for this basename: AST:2,ALT:2,ALKPHOS:2,BILITOT:2,PROT:2,ALBUMIN:2 in the last 72 hours No results found for this basename: LIPASE:2,AMYLASE:2 in the last 72 hours No results found for this basename: AMMONIA:2 in the last 72 hours CBC:  Basename 11/03/10 0450 11/02/10 0505  WBC 6.2 9.5  NEUTROABS -- --  HGB 10.6* 11.9*  HCT 34.5* 39.0  MCV 91.3 91.5  PLT  217 212   Cardiac Enzymes:  Basename 11/01/10 0605 11/01/10 0103 10/31/10 1745  CKTOTAL 154 136 110  CKMB 10.4* 9.8* 7.8*  CKMBINDEX -- -- --  TROPONINI <0.30 <0.30 <0.30   BNP: No results found for this basename: POCBNP:3 in the last 72 hours D-Dimer:  [facility] 10/31/10 1033  DDIMER 1.09*   CBG: No results found for this basename: GLUCAP:6 in the last 72 hours Hemoglobin A1C: No results found for this basename: HGBA1C in the last 72 hours Fasting Lipid Panel: No results found for this basename: CHOL,HDL,LDLCALC,TRIG,CHOLHDL,LDLDIRECT in the last 72 hours Thyroid Function Tests:  Basename 10/31/10 1745  TSH 0.413  T4TOTAL --  FREET4 --  T3FREE --  THYROIDAB --   Anemia  Panel: No results found for this basename: VITAMINB12,FOLATE,FERRITIN,TIBC,IRON,RETICCTPCT in the last 72 hours Urine Drug Screen:  Alcohol Level: No results found for this basename: ETH:2 in the last 72 hours Urinalysis:  Misc. Labs:   ABGS:  Basename 11/02/10 1325  PHART 7.366  PO2ART 92.5  TCO2 32.6  HCO3 35.6*     MICROBIOLOGY: Recent Results (from the past 240 hour(s))  URINE CULTURE     Status: Normal   Collection Time   10/31/10 11:37 AM      Component Value Range Status Comment   Specimen Description URINE, CLEAN CATCH   Final    Special Requests NONE   Final    Setup Time [id number]   Final    Colony Count 9,000 COLONIES/ML   Final    Culture INSIGNIFICANT GROWTH   Final    Report Status 11/02/2010 FINAL   Final   MRSA PCR SCREENING     Status: Normal   Collection Time   11/01/10 11:10 AM      Component Value Range Status Comment   MRSA by PCR NEGATIVE  NEGATIVE  Final     Studies/Results: Dg Chest 1 View  11/02/2010  *RADIOLOGY REPORT*  Clinical Data: Short of breath.  COPD.  CHEST - 1 VIEW  Comparison: 11/01/2010  Findings: Severe pulmonary emphysema is again demonstrated.  No evidence of acute infiltrate or pleural effusion.  Mild left basilar scarring and old left rib fracture deformities are again seen.  Heart size is normal.  IMPRESSION: Severe pulmonary emphysema.  No acute findings.  Original Report Authenticated By: Danae Orleans, M.D.   Dg Chest Portable 1 View  11/03/2010  *RADIOLOGY REPORT*  Clinical Data: Respiratory failure.  PORTABLE CHEST - 1 VIEW  Comparison: 11/02/2010  Findings: There is hyperinflation of the lungs compatible with COPD.  Heart and mediastinal contours are within normal limits.  No focal opacities or effusions.  No acute bony abnormality.  IMPRESSION: Stable severe COPD.  No active disease.  Original Report Authenticated By: Cyndie Chime, M.D.   Dg Chest Portable 1 View  11/01/2010  *RADIOLOGY REPORT*  Clinical Data:  75 year old female with chest pain and shortness of breath.  PORTABLE CHEST - 1 VIEW  Comparison: 10/31/2010 and earlier.  Findings: Portable AP upright view 2100 hours.  Chronic pulmonary hyperinflation.  Cardiac size and mediastinal contours are within normal limits.  Visualized tracheal air column is within normal limits.  No pneumothorax, pulmonary edema, definite pleural effusion, or acute pulmonary opacity.  IMPRESSION: Chronic pulmonary hyperinflation. No superimposed acute findings are identified.  Original Report Authenticated By: Harley Hallmark, M.D.    Medications:  Prior to Admission:  Prescriptions prior to admission  Medication Sig Dispense Refill  . Calcium Carbonate-Vitamin D (CALCIUM  600+D) 600-400 MG-UNIT per tablet Take 1 tablet by mouth daily.        . cholecalciferol (VITAMIN D) 1000 UNITS tablet Take 1,000 Units by mouth daily.        . meclizine (ANTIVERT) 25 MG tablet Take 25 mg by mouth 4 (four) times daily as needed. FOR NAUSEA        . methimazole (TAPAZOLE) 5 MG tablet Take 2.5 mg by mouth every other day. PATIENT CALLS THIS HER "THYROID PILL"       . DISCONTD: albuterol (PROVENTIL HFA;VENTOLIN HFA) 108 (90 BASE) MCG/ACT inhaler Inhale 2 puffs into the lungs every 4 (four) hours as needed. FOR SHORTNESS OF BREATH        Scheduled:   . azithromycin  500 mg Intravenous Q24H  . calcium-vitamin D  1 tablet Oral Daily  . cefTRIAXone (ROCEPHIN) IV  1 g Intravenous Q24H  . cholecalciferol  1,000 Units Oral Daily  . enoxaparin (LOVENOX) injection  1.5 mg/kg Subcutaneous Q24H  . Fluticasone-Salmeterol  1 puff Inhalation BID  . guaiFENesin  600 mg Oral BID  . ipratropium  0.5 mg Nebulization Q6H  . levalbuterol  0.63 mg Nebulization Q6H  . LORazepam  0.26 mg Intravenous Once  . methimazole  2.5 mg Oral QODAY  . methylPREDNISolone sodium succinate  60 mg Intravenous Q8H  . pantoprazole  40 mg Oral Q1200  . pneumococcal 23 valent vaccine  0.5 mL Intramuscular  Tomorrow-1000  . sodium chloride  3 mL Intravenous Q12H  . sodium chloride      . sodium chloride       Continuous:  ZOX:[id number], acetaminophen, alum & mag hydroxide-simeth, antiseptic oral rinse, guaiFENesin-dextromethorphan, HYDROcodone-acetaminophen, levalbuterol, LORazepam, meclizine, sodium chloride  Assesment: She has acute on chronic respiratory failure. She is on appropriate treatment. Principal Problem:  *Respiratory failure, acute and chronic Active Problems:  COPD exacerbation  Tachycardia  Thyroid disorder  Hypoxia  Hypokalemia    Plan: I don't think is anything to add. We should continue with her current treatments including BiPAP and may be able to take her off later today    LOS: 3 days   Haseeb Fiallos L 11/03/2010, 8:35 AM

## 2010-11-03 NOTE — Progress Notes (Signed)
ANTICOAGULATION CONSULT NOTE Pharmacy Consult for Lovenox Indication: R/O pulmonary embolus  Allergies  Allergen Reactions  . Food Shortness Of Breath    ONIONS  . Promist La (Pseudoephedrine-Guaifenesin) Shortness Of Breath  . Sulfa Antibiotics Nausea And Vomiting  . Aspirin Other (See Comments)    "FELT AS IF SHE WOULD DIE"  . Iodine Other (See Comments)    FELT "BAD" EXAUSTED   . Latex Itching    PLASTIC BANDAIDS  . Prednisone Palpitations    SPEEDS HEART RATE    Patient Measurements: Height: 5\' 2"  (157.5 cm) Weight: 84 lb 7 oz (38.3 kg) IBW/kg (Calculated) : 50.1    Vital Signs: Temp: 98 F (36.7 C) (10/01 0400) Temp src: Axillary (10/01 0400) BP: 98/54 mmHg (10/01 0400) Pulse Rate: 107  (10/01 0400)  Labs:  Basename 11/03/10 0450 11/02/10 0505 11/01/10 0605 11/01/10 0103 10/31/10 1745  HGB 10.6* 11.9* -- -- --  HCT 34.5* 39.0 33.7* -- --  PLT 217 212 168 -- --  APTT -- -- -- -- --  LABPROT -- 12.6 -- -- --  INR -- 0.92 -- -- --  HEPARINUNFRC -- -- -- -- --  CREATININE <0.47* <0.47* <0.47* -- --  CRCLEARANCE -- -- -- -- --  CKTOTAL -- -- 154 136 110  CKMB -- -- 10.4* 9.8* 7.8*  TROPONINI -- -- <0.30 <0.30 <0.30   CrCl cannot be calculated (Patient has no sCr result on file.).   Medical History: Past Medical History  Diagnosis Date  . Arthritis   . Asthma   . COPD (chronic obstructive pulmonary disease)   . Thyroid disorder 10/31/2010    Medications:  Scheduled:     . azithromycin  500 mg Intravenous Q24H  . calcium-vitamin D  1 tablet Oral Daily  . cefTRIAXone (ROCEPHIN) IV  1 g Intravenous Q24H  . cholecalciferol  1,000 Units Oral Daily  . enoxaparin (LOVENOX) injection  1.5 mg/kg Subcutaneous Q24H  . Fluticasone-Salmeterol  1 puff Inhalation BID  . guaiFENesin  600 mg Oral BID  . ipratropium  0.5 mg Nebulization Q6H  . levalbuterol  0.63 mg Nebulization Q6H  . LORazepam  0.26 mg Intravenous Once  . methimazole  2.5 mg Oral QODAY  .  methylPREDNISolone sodium succinate  60 mg Intravenous Q8H  . pantoprazole  40 mg Oral Q1200  . pneumococcal 23 valent vaccine  0.5 mL Intramuscular Tomorrow-1000  . sodium chloride  3 mL Intravenous Q12H  . sodium chloride      . sodium chloride        Assessment: Okay for Protocol Estimated CrCl = 11ml/min Awaiting PE confirmation via diagnostic method (VQ scan)  Goal of Therapy:  Appropriate Dosing   Plan:  Lovenox treatment dose 1.5mg ./kg every 24 hours. CBC every 3 days. Lamonte Richer R 11/03/2010,8:53 AM

## 2010-11-03 NOTE — Progress Notes (Signed)
Pt is very hard to use Bipap as mask and face do not seal , every size tried , taken off servo i as bipap and using v60 bipap machine apears to be doing better

## 2010-11-04 LAB — CBC
Hemoglobin: 11.2 g/dL — ABNORMAL LOW (ref 12.0–15.0)
MCH: 27.4 pg (ref 26.0–34.0)
RBC: 4.09 MIL/uL (ref 3.87–5.11)
WBC: 9.1 10*3/uL (ref 4.0–10.5)

## 2010-11-04 LAB — BASIC METABOLIC PANEL
CO2: 41 mEq/L (ref 19–32)
Glucose, Bld: 105 mg/dL — ABNORMAL HIGH (ref 70–99)
Potassium: 4 mEq/L (ref 3.5–5.1)
Sodium: 142 mEq/L (ref 135–145)

## 2010-11-04 MED ORDER — AZITHROMYCIN 250 MG PO TABS
500.0000 mg | ORAL_TABLET | Freq: Every day | ORAL | Status: DC
  Administered 2010-11-04 – 2010-11-05 (×2): 500 mg via ORAL
  Filled 2010-11-04 (×2): qty 2

## 2010-11-04 MED ORDER — ENOXAPARIN SODIUM 30 MG/0.3ML ~~LOC~~ SOLN
30.0000 mg | SUBCUTANEOUS | Status: DC
  Administered 2010-11-05 – 2010-11-07 (×3): 30 mg via SUBCUTANEOUS
  Filled 2010-11-04 (×3): qty 0.3

## 2010-11-04 NOTE — Progress Notes (Signed)
Subjective: Patient states her breathing is somewhat better patient is currently off BiPAP. Less use of accessory muscles of respirations. Patient still coughing. Objective: Vital signs in last 24 hours: Filed Vitals:   11/04/10 0700 11/04/10 0737 11/04/10 0800 11/04/10 0900  BP: 120/73   144/78  Pulse: 76  128   Temp:      TempSrc:      Resp: 17  22 22   Height:      Weight:      SpO2: 100% 100% 98% 97%    Intake/Output Summary (Last 24 hours) at 11/04/10 0928 Last data filed at 11/04/10 0100  Gross per 24 hour  Intake   2123 ml  Output    400 ml  Net   1723 ml    Weight change:   General: Alert, awake, oriented x3, in no acute distress. Some use of accessory muscles of respiration. HEENT: No bruits, no goiter. Heart: Tachycardic, without murmurs, rubs, gallops. Lungs: Poor  Air movement less tight. No wheezing. Abdomen: Soft, nontender, nondistended, positive bowel sounds. Extremities: No clubbing cyanosis or edema with positive pedal pulses. Neuro: Grossly intact, nonfocal.    Lab Results: Results for orders placed during the hospital encounter of 10/31/10 (from the past 24 hour(s))  BASIC METABOLIC PANEL     Status: Abnormal   Collection Time   11/04/10  6:28 AM      Component Value Range   Sodium 142  135 - 145 (mEq/L)   Potassium 4.0  3.5 - 5.1 (mEq/L)   Chloride 97  96 - 112 (mEq/L)   CO2 41 (*) 19 - 32 (mEq/L)   Glucose, Bld 105 (*) 70 - 99 (mg/dL)   BUN 20  6 - 23 (mg/dL)   Creatinine, Ser <1.61 (*) 0.50 - 1.10 (mg/dL)   Calcium 09.6  8.4 - 10.5 (mg/dL)   GFR calc non Af Amer NOT CALCULATED  >90 (mL/min)   GFR calc Af Amer NOT CALCULATED  >90 (mL/min)  CBC     Status: Abnormal   Collection Time   11/04/10  6:28 AM      Component Value Range   WBC 9.1  4.0 - 10.5 (K/uL)   RBC 4.09  3.87 - 5.11 (MIL/uL)   Hemoglobin 11.2 (*) 12.0 - 15.0 (g/dL)   HCT 04.5  40.9 - 81.1 (%)   MCV 91.0  78.0 - 100.0 (fL)   MCH 27.4  26.0 - 34.0 (pg)   MCHC 30.1  30.0 -  36.0 (g/dL)   RDW 91.4  78.2 - 95.6 (%)   Platelets 234  150 - 400 (K/uL)     Micro: Recent Results (from the past 240 hour(s))  URINE CULTURE     Status: Normal   Collection Time   10/31/10 11:37 AM      Component Value Range Status Comment   Specimen Description URINE, CLEAN CATCH   Final    Special Requests NONE   Final    Setup Time [id number]   Final    Colony Count 9,000 COLONIES/ML   Final    Culture INSIGNIFICANT GROWTH   Final    Report Status 11/02/2010 FINAL   Final   MRSA PCR SCREENING     Status: Normal   Collection Time   11/01/10 11:10 AM      Component Value Range Status Comment   MRSA by PCR NEGATIVE  NEGATIVE  Final     Studies/Results: Dg Chest Portable 1 View  11/03/2010  *RADIOLOGY REPORT*  Clinical Data: Respiratory failure.  PORTABLE CHEST - 1 VIEW  Comparison: 11/02/2010  Findings: There is hyperinflation of the lungs compatible with COPD.  Heart and mediastinal contours are within normal limits.  No focal opacities or effusions.  No acute bony abnormality.  IMPRESSION: Stable severe COPD.  No active disease.  Original Report Authenticated By: Cyndie Chime, M.D.    Medications:     . azithromycin  500 mg Intravenous Q24H  . calcium-vitamin D  1 tablet Oral Daily  . cefTRIAXone (ROCEPHIN) IV  1 g Intravenous Q24H  . cholecalciferol  1,000 Units Oral Daily  . enoxaparin (LOVENOX) injection  1.5 mg/kg Subcutaneous Q24H  . feeding supplement  30 mL Oral TID WC  . feeding supplement  30 mL Oral TID WC  . Fluticasone-Salmeterol  1 puff Inhalation BID  . guaiFENesin  600 mg Oral BID  . ipratropium  0.5 mg Nebulization Q6H  . levalbuterol  0.63 mg Nebulization Q6H  . methimazole  2.5 mg Oral QODAY  . methylPREDNISolone sodium succinate  60 mg Intravenous Q8H  . pantoprazole  40 mg Oral Q1200  . pneumococcal 23 valent vaccine  0.5 mL Intramuscular Tomorrow-1000  . sodium chloride  3 mL Intravenous Q12H     Assessment/Plan Principal Problem:   *Respiratory failure, acute and chronic Active Problems:  COPD exacerbation  Tachycardia  Thyroid disorder  Hypoxia    #1 acute on chronic respiratory failure- secondary to acute COPD exacerbation. Patients breathing has improved on the BiPAP patient is using less use of accessory muscles of respiration. Air movement is less tight with poor to fair air movement. Patient is alert and following commands.  She did not do too well off BiPAP yesterday and had to be placed back on the BiPAP. And is currently off the BiPAP we'll see how she does.  Will continue patient on Solu-Medrol 60 mg IV q. 8 hours, continue empiric antibiotics of azithromycin and Rocephin (day #4), continue nebulizer treatments, mucinex and follow. BiPAP as needed.  Dr. Juanetta Gosling of  pulmonary is on board and following, appreciate his input and recommendations. #2. Acute COPD exacerbation please see problem #1  3. sinus tachycardia secondary to problem #1. Improved.  #4 is hypokalemia resolved  #5 thyroid disorder- TSH was 0.413 continue home regimen of methimazole.  #6 prophylaxis-Lovenox for DVT prophylaxis we'll place on Protonix for for GI prophylaxis    LOS: 4 days   [facility] 11/04/2010, 9:28 AM

## 2010-11-04 NOTE — Progress Notes (Signed)
Subjective: She looks better this morning. She is less short of breath. She has been able to be off of BiPAP some today although she did not do well with that yesterday. She says she still coughing some and still congested but less than previously  Objective: Vital signs in last 24 hours: Temp:  [97.3 F (36.3 C)-97.9 F (36.6 C)] 97.5 F (36.4 C) (10/02 0400) Pulse Rate:  [72-130] 72  (10/02 0500) Resp:  [17-26] 17  (10/02 0500) BP: (88-141)/(57-95) 107/64 mmHg (10/02 0500) SpO2:  [94 %-100 %] 100 % (10/02 0737) FiO2 (%):  [32 %-50 %] 50 % (10/01 1540) Weight change:  Last BM Date:  (unknown)  Intake/Output from previous day: 10/01 0701 - 10/02 0700 In: 2183 [P.O.:1880; I.V.:3; IV Piggyback:300] Out: 400 [Urine:400]  PHYSICAL EXAM General appearance: alert, cooperative, appears older than stated age, cachectic and mild distress Resp: rhonchi bilaterally Cardio: regular rate and rhythm, S1, S2 normal, no murmur, click, rub or gallop GI: soft, non-tender; bowel sounds normal; no masses,  no organomegaly Extremities: extremities normal, atraumatic, no cyanosis or edema  Lab Results:    Basic Metabolic Panel:  Basename 11/04/10 0628 11/03/10 0450  NA 142 139  K 4.0 3.8  CL 97 95*  CO2 41* 38*  GLUCOSE 105* 104*  BUN 20 19  CREATININE <0.47* <0.47*  CALCIUM 10.1 9.5  MG -- --  PHOS -- --   Liver Function Tests: No results found for this basename: AST:2,ALT:2,ALKPHOS:2,BILITOT:2,PROT:2,ALBUMIN:2 in the last 72 hours No results found for this basename: LIPASE:2,AMYLASE:2 in the last 72 hours No results found for this basename: AMMONIA:2 in the last 72 hours CBC:  Basename 11/04/10 0628 11/03/10 0450  WBC 9.1 6.2  NEUTROABS -- --  HGB 11.2* 10.6*  HCT 37.2 34.5*  MCV 91.0 91.3  PLT 234 217   Cardiac Enzymes: No results found for this basename: CKTOTAL:3,CKMB:3,CKMBINDEX:3,TROPONINI:3 in the last 72 hours BNP: No results found for this basename: POCBNP:3 in the  last 72 hours D-Dimer: No results found for this basename: DDIMER:2 in the last 72 hours CBG: No results found for this basename: GLUCAP:6 in the last 72 hours Hemoglobin A1C: No results found for this basename: HGBA1C in the last 72 hours Fasting Lipid Panel: No results found for this basename: CHOL,HDL,LDLCALC,TRIG,CHOLHDL,LDLDIRECT in the last 72 hours Thyroid Function Tests: No results found for this basename: TSH,T4TOTAL,FREET4,T3FREE,THYROIDAB in the last 72 hours Anemia Panel: No results found for this basename: VITAMINB12,FOLATE,FERRITIN,TIBC,IRON,RETICCTPCT in the last 72 hours Urine Drug Screen:  Alcohol Level: No results found for this basename: ETH:2 in the last 72 hours Urinalysis:  Misc. Labs:  ABGS  Basename 11/02/10 1325  PHART 7.366  PO2ART 92.5  TCO2 32.6  HCO3 35.6*   CULTURES Recent Results (from the past 240 hour(s))  URINE CULTURE     Status: Normal   Collection Time   10/31/10 11:37 AM      Component Value Range Status Comment   Specimen Description URINE, CLEAN CATCH   Final    Special Requests NONE   Final    Setup Time [id number]   Final    Colony Count 9,000 COLONIES/ML   Final    Culture INSIGNIFICANT GROWTH   Final    Report Status 11/02/2010 FINAL   Final   MRSA PCR SCREENING     Status: Normal   Collection Time   11/01/10 11:10 AM      Component Value Range Status Comment   MRSA by PCR NEGATIVE  NEGATIVE  Final    Studies/Results: Dg Chest 1 View  11/02/2010  *RADIOLOGY REPORT*  Clinical Data: Short of breath.  COPD.  CHEST - 1 VIEW  Comparison: 11/01/2010  Findings: Severe pulmonary emphysema is again demonstrated.  No evidence of acute infiltrate or pleural effusion.  Mild left basilar scarring and old left rib fracture deformities are again seen.  Heart size is normal.  IMPRESSION: Severe pulmonary emphysema.  No acute findings.  Original Report Authenticated By: Danae Orleans, M.D.   Dg Chest Portable 1 View  11/03/2010   *RADIOLOGY REPORT*  Clinical Data: Respiratory failure.  PORTABLE CHEST - 1 VIEW  Comparison: 11/02/2010  Findings: There is hyperinflation of the lungs compatible with COPD.  Heart and mediastinal contours are within normal limits.  No focal opacities or effusions.  No acute bony abnormality.  IMPRESSION: Stable severe COPD.  No active disease.  Original Report Authenticated By: Cyndie Chime, M.D.    Medications:  Scheduled:   . azithromycin  500 mg Intravenous Q24H  . calcium-vitamin D  1 tablet Oral Daily  . cefTRIAXone (ROCEPHIN) IV  1 g Intravenous Q24H  . cholecalciferol  1,000 Units Oral Daily  . enoxaparin (LOVENOX) injection  1.5 mg/kg Subcutaneous Q24H  . feeding supplement  30 mL Oral TID WC  . feeding supplement  30 mL Oral TID WC  . Fluticasone-Salmeterol  1 puff Inhalation BID  . guaiFENesin  600 mg Oral BID  . ipratropium  0.5 mg Nebulization Q6H  . levalbuterol  0.63 mg Nebulization Q6H  . methimazole  2.5 mg Oral QODAY  . methylPREDNISolone sodium succinate  60 mg Intravenous Q8H  . pantoprazole  40 mg Oral Q1200  . pneumococcal 23 valent vaccine  0.5 mL Intramuscular Tomorrow-1000  . sodium chloride  3 mL Intravenous Q12H   Continuous:  WUJ:[id number], acetaminophen, alum & mag hydroxide-simeth, antiseptic oral rinse, guaiFENesin-dextromethorphan, HYDROcodone-acetaminophen, levalbuterol, LORazepam, meclizine, sodium chloride  Assesment: She has COPD with acute on chronic respiratory failure. She appears to be malnourished probably from COPD but she does have multiple other medical. She is improved overall Principal Problem:  *Respiratory failure, acute and chronic Active Problems:  COPD exacerbation  Tachycardia  Thyroid disorder  Hypoxia  Hypokalemia    Plan: I would continue his current treatments I'm going to see if she can be off of BiPAP for any period of time    LOS: 4 days   Bernardine Langworthy L 11/04/2010, 7:55 AM

## 2010-11-05 DIAGNOSIS — Z299 Encounter for prophylactic measures, unspecified: Secondary | ICD-10-CM

## 2010-11-05 DIAGNOSIS — E43 Unspecified severe protein-calorie malnutrition: Secondary | ICD-10-CM | POA: Diagnosis present

## 2010-11-05 LAB — BASIC METABOLIC PANEL
BUN: 19 mg/dL (ref 6–23)
Creatinine, Ser: <0.47 mg/dL — ABNORMAL LOW (ref 0.50–1.10)
Potassium: 3.6 mEq/L (ref 3.5–5.1)

## 2010-11-05 LAB — BLOOD GAS, ARTERIAL
Bicarbonate: 42.3 mEq/L — ABNORMAL HIGH (ref 20.0–24.0)
O2 Saturation: 98 %
Patient temperature: 37
TCO2: 38.4 mmol/L (ref 0–100)

## 2010-11-05 LAB — CBC
MCHC: 30.4 g/dL (ref 30.0–36.0)
Platelets: 268 10*3/uL (ref 150–400)
RDW: 14.3 % (ref 11.5–15.5)

## 2010-11-05 LAB — VITAMIN B12: Vitamin B-12: 653 pg/mL (ref 211–911)

## 2010-11-05 MED ORDER — SODIUM CHLORIDE 0.9 % IJ SOLN
INTRAMUSCULAR | Status: AC
  Filled 2010-11-05: qty 20

## 2010-11-05 NOTE — Progress Notes (Signed)
CRITICAL VALUE ALERT  Critical value received:  CO2 44  Date of notification:  11/05/10  Time of notification:  0630  Critical value read back: yes  Nurse who received alert:  Rodena Medin RN  MD notified (1st page):  Dr Rito Ehrlich  Time of first page:  0645  MD notified (2nd page):n/a  Time of second page:n/a  Responding MD:  n/a  Time MD responded:  n/a

## 2010-11-05 NOTE — Progress Notes (Signed)
Subjective: Patient sitting up at the bedside. States that she feels better today than yesterday. However not back to baseline. The patient states that she uses 2 L of oxygen per minute at home. She used the BiPAP last night which he tolerated well but had taken off at approximately 6 AM this morning. The patient does not usually followed with a lung specialist as an outpatient. However I spoken with Dr. Juanetta Gosling and the patient will followup with him as an outpatient upon her discharge from the hospital. Objective: Filed Vitals:   11/05/10 0400 11/05/10 0500 11/05/10 0600 11/05/10 0816  BP: 112/59 111/67 112/75   Pulse: 65 63 58   Temp:      TempSrc:      Resp: 17 17 15 16   Height:      Weight:      SpO2: 97% 100% 100% 94%   Weight change:   Intake/Output Summary (Last 24 hours) at 11/05/10 0854 Last data filed at 11/05/10 0600  Gross per 24 hour  Intake    770 ml  Output    500 ml  Net    270 ml    General: Alert, awake, oriented x3, in no acute distress.  HEENT: [place]/AT PEERL, EOMI Neck: Trachea midline,  no masses, no thyromegal,y no JVD, no carotid bruit OROPHARYNX:  Moist, No exudate/ erythema/lesions.  Heart: Regular rate and rhythm, mild tachycardia noted.No murmurs, rubs, gallops, PMI non-displaced, no heaves or thrills on palpation. Telemetry shows sinus tachycardia. Lungs: Clear to auscultation, no wheezing or rhonchi noted. Poor air flow noted throughout lung fields. No increased vocal fremitus . Patient has markedly kyphotic posture appear Abdomen: Soft, nontender, nondistended, positive bowel sounds, no masses no hepatosplenomegaly noted..  Neuro: No focal neurological deficits noted cranial nerves II through XII grossly intact. DTRs 2+ bilaterally upper and lower extremities. Strength 5 out of 5 in bilateral upper and lower extremities. Musculoskeletal: No warm swelling or erythema around joints, no spinal tenderness noted. Psychiatric: Patient alert and oriented x3, good  insight and cognition, good recent to remote recall. Lymph node survey: No cervical axillary or inguinal lymphadenopathy noted.     Lab Results:  [facility] 11/05/10 0458 11/04/10 0628  NA 143 142  K 3.6 4.0  CL 98 97  CO2 44* 41*  GLUCOSE 105* 105*  BUN 19 20  CREATININE <0.47* <0.47*  CALCIUM 9.5 10.1  MG -- --  PHOS -- --      Basename 11/05/10 0458 11/04/10 0628  WBC 8.3 9.1  NEUTROABS -- --  HGB 11.0* 11.2*  HCT 36.2 37.2  MCV 90.7 91.0  PLT 268 234    TSH: 0.413 (10/31/2010)      Micro Results: Recent Results (from the past 240 hour(s))  URINE CULTURE     Status: Normal   Collection Time   10/31/10 11:37 AM      Component Value Range Status Comment   Specimen Description URINE, CLEAN CATCH   Final    Special Requests NONE   Final    Setup Time [id number]   Final    Colony Count 9,000 COLONIES/ML   Final    Culture INSIGNIFICANT GROWTH   Final    Report Status 11/02/2010 FINAL   Final   MRSA PCR SCREENING     Status: Normal   Collection Time   11/01/10 11:10 AM      Component Value Range Status Comment   MRSA by PCR NEGATIVE  NEGATIVE  Final  Studies/Results: Dg Chest 1 View  11/02/2010  *RADIOLOGY REPORT*  Clinical Data: Short of breath.  COPD.  CHEST - 1 VIEW  Comparison: 11/01/2010  Findings: Severe pulmonary emphysema is again demonstrated.  No evidence of acute infiltrate or pleural effusion.  Mild left basilar scarring and old left rib fracture deformities are again seen.  Heart size is normal.  IMPRESSION: Severe pulmonary emphysema.  No acute findings.  Original Report Authenticated By: Danae Orleans, M.D.   Dg Chest Portable 1 View  11/03/2010  *RADIOLOGY REPORT*  Clinical Data: Respiratory failure.  PORTABLE CHEST - 1 VIEW  Comparison: 11/02/2010  Findings: There is hyperinflation of the lungs compatible with COPD.  Heart and mediastinal contours are within normal limits.  No focal opacities or effusions.  No acute bony abnormality.   IMPRESSION: Stable severe COPD.  No active disease.  Original Report Authenticated By: Cyndie Chime, M.D.   Dg Chest Portable 1 View  11/01/2010  *RADIOLOGY REPORT*  Clinical Data: 75 year old female with chest pain and shortness of breath.  PORTABLE CHEST - 1 VIEW  Comparison: 10/31/2010 and earlier.  Findings: Portable AP upright view 2100 hours.  Chronic pulmonary hyperinflation.  Cardiac size and mediastinal contours are within normal limits.  Visualized tracheal air column is within normal limits.  No pneumothorax, pulmonary edema, definite pleural effusion, or acute pulmonary opacity.  IMPRESSION: Chronic pulmonary hyperinflation. No superimposed acute findings are identified.  Original Report Authenticated By: Harley Hallmark, M.D.   Dg Chest Portable 1 View  10/31/2010  *RADIOLOGY REPORT*  Clinical Data: History of COPD, asthma, now with shortness of breath  PORTABLE CHEST - 1 VIEW  Comparison: 02/25/2007; 02/01/2007; 08/14/2006; chest CT - 02/01/2007  Findings: Unchanged cardiac silhouette and mediastinal contours with mild tortuosity of the descending thoracic aorta.  The lungs remain hyperinflated with flattening of the bilateral hemidiaphragms.  No new focal parenchymal opacities.  No definite pleural effusion or pneumothorax.  Unchanged bones.  IMPRESSION: Hyperinflated lungs without acute cardiopulmonary disease.  Further evaluation may be performed with a PA and lateral chest radiograph as clinically indicated.  Original Report Authenticated By: Waynard Reeds, M.D.    Medications: I have reviewed the patient's current medications. Scheduled Meds:   . azithromycin  500 mg Oral Daily  . calcium-vitamin D  1 tablet Oral Daily  . cefTRIAXone (ROCEPHIN) IV  1 g Intravenous Q24H  . cholecalciferol  1,000 Units Oral Daily  . enoxaparin (LOVENOX) injection  30 mg Subcutaneous Q24H  . feeding supplement  30 mL Oral TID WC  . feeding supplement  30 mL Oral TID WC  . Fluticasone-Salmeterol   1 puff Inhalation BID  . guaiFENesin  600 mg Oral BID  . ipratropium  0.5 mg Nebulization Q6H  . levalbuterol  0.63 mg Nebulization Q6H  . methimazole  2.5 mg Oral QODAY  . methylPREDNISolone sodium succinate  60 mg Intravenous Q8H  . pantoprazole  40 mg Oral Q1200  . sodium chloride  3 mL Intravenous Q12H  . DISCONTD: azithromycin  500 mg Intravenous Q24H  . DISCONTD: enoxaparin (LOVENOX) injection  1.5 mg/kg Subcutaneous Q24H   Continuous Infusions:  PRN Meds:.acetaminophen, acetaminophen, alum & mag hydroxide-simeth, antiseptic oral rinse, guaiFENesin-dextromethorphan, HYDROcodone-acetaminophen, levalbuterol, LORazepam, meclizine, sodium chloride Assessment/Plan: Patient Active Hospital Problem List: Respiratory failure, acute and chronic (10/31/2010)   Assessment: Patient improving since yesterday. Presently on 3 L of oxygen which is above her baseline. I spoke with patient's consulting pulmonologist Dr. Shaune Pollack who by  his estimation the patient is also improved. The plan for today 3 the patient off BiPAP for some time today and to assess her performance off BiPAP. If the patient does well today we will consider moving her out of the ICU tomorrow and further observing her clinical course. The patient will continue on her Xopenex and Atrovent. She'll continue on Solu-Medrol for today and will consider transitioning her to oral prednisone starting on tomorrow she continues to do well by mouth the patient is currently on a azithromycin and Rocephin will continue on this regimen of antibiotics. Continue protonic springy reflux symptoms that may be contributing to acute component of respiratory failure.    Plan: See above  COPD exacerbation (10/31/2010)   Assessment: See above    Plan: See above  Tachycardia (10/31/2010)   Assessment: I suspect this patient has a resting tachycardia. Some of this may be contributing to 5 the beta agonist. However despite her tachycardia the patient appears to be  able to perform her ADLs including eating breakfast. We will ask occupational therapy to see the patient in consultation to assess her further ADLs in terms of dressing. We'll also ask physical therapy is to see the patient for evaluation and treatment    Plan: See above  Thyroid disorder (10/31/2010)   Assessment: TSH on 9/28 was 0.413    Plan: Continue Tapazole  Hypoxia (10/31/2010)   Assessment: Still present below baseline of 2 L of oxygen per minute    Plan: Continue treatment of an acute exacerbation of COPD Severe protein calorie malnutrition: Liberalize diet to allow for adequate protein calorie intake. Best practice: Continue Lovenox for DVT prophylaxis   LOS: 5 days

## 2010-11-05 NOTE — Progress Notes (Signed)
Subjective: She seems to be doing a little bit better. She has no new complaints.  Objective: Vital signs in last 24 hours: Temp:  [97.8 F (36.6 C)-98.6 F (37 C)] 97.8 F (36.6 C) (10/02 1800) Pulse Rate:  [58-134] 58  (10/03 0600) Resp:  [15-27] 16  (10/03 0816) BP: (99-149)/(57-101) 112/75 mmHg (10/03 0600) SpO2:  [85 %-100 %] 94 % (10/03 0816) Weight change:  Last BM Date:  (unknown)  Intake/Output from previous day: 10/02 0701 - 10/03 0700 In: 770 [P.O.:720; IV Piggyback:50] Out: 500 [Urine:500]  PHYSICAL EXAM General appearance: alert, cooperative and mild distress Resp: rhonchi bilaterally Cardio: regular rate and rhythm, S1, S2 normal, no murmur, click, rub or gallop GI: soft, non-tender; bowel sounds normal; no masses,  no organomegaly Extremities: extremities normal, atraumatic, no cyanosis or edema  Lab Results:    Basic Metabolic Panel:  Basename 11/05/10 0458 11/04/10 0628  NA 143 142  K 3.6 4.0  CL 98 97  CO2 44* 41*  GLUCOSE 105* 105*  BUN 19 20  CREATININE <0.47* <0.47*  CALCIUM 9.5 10.1  MG -- --  PHOS -- --   Liver Function Tests: No results found for this basename: AST:2,ALT:2,ALKPHOS:2,BILITOT:2,PROT:2,ALBUMIN:2 in the last 72 hours No results found for this basename: LIPASE:2,AMYLASE:2 in the last 72 hours No results found for this basename: AMMONIA:2 in the last 72 hours CBC:  Basename 11/05/10 0458 11/04/10 0628  WBC 8.3 9.1  NEUTROABS -- --  HGB 11.0* 11.2*  HCT 36.2 37.2  MCV 90.7 91.0  PLT 268 234   Cardiac Enzymes: No results found for this basename: CKTOTAL:3,CKMB:3,CKMBINDEX:3,TROPONINI:3 in the last 72 hours BNP: No results found for this basename: POCBNP:3 in the last 72 hours D-Dimer: No results found for this basename: DDIMER:2 in the last 72 hours CBG: No results found for this basename: GLUCAP:6 in the last 72 hours Hemoglobin A1C: No results found for this basename: HGBA1C in the last 72 hours Fasting Lipid  Panel: No results found for this basename: CHOL,HDL,LDLCALC,TRIG,CHOLHDL,LDLDIRECT in the last 72 hours Thyroid Function Tests: No results found for this basename: TSH,T4TOTAL,FREET4,T3FREE,THYROIDAB in the last 72 hours Anemia Panel: No results found for this basename: VITAMINB12,FOLATE,FERRITIN,TIBC,IRON,RETICCTPCT in the last 72 hours Urine Drug Screen:  Alcohol Level: No results found for this basename: ETH:2 in the last 72 hours Urinalysis:  Misc. Labs:  ABGS  Basename 11/02/10 1325  PHART 7.366  PO2ART 92.5  TCO2 32.6  HCO3 35.6*   CULTURES Recent Results (from the past 240 hour(s))  URINE CULTURE     Status: Normal   Collection Time   10/31/10 11:37 AM      Component Value Range Status Comment   Specimen Description URINE, CLEAN CATCH   Final    Special Requests NONE   Final    Setup Time [id number]   Final    Colony Count 9,000 COLONIES/ML   Final    Culture INSIGNIFICANT GROWTH   Final    Report Status 11/02/2010 FINAL   Final   MRSA PCR SCREENING     Status: Normal   Collection Time   11/01/10 11:10 AM      Component Value Range Status Comment   MRSA by PCR NEGATIVE  NEGATIVE  Final    Studies/Results: No results found.  Medications:  Scheduled:   . azithromycin  500 mg Oral Daily  . calcium-vitamin D  1 tablet Oral Daily  . cefTRIAXone (ROCEPHIN) IV  1 g Intravenous Q24H  . cholecalciferol  1,000  Units Oral Daily  . enoxaparin (LOVENOX) injection  30 mg Subcutaneous Q24H  . feeding supplement  30 mL Oral TID WC  . feeding supplement  30 mL Oral TID WC  . Fluticasone-Salmeterol  1 puff Inhalation BID  . guaiFENesin  600 mg Oral BID  . ipratropium  0.5 mg Nebulization Q6H  . levalbuterol  0.63 mg Nebulization Q6H  . methimazole  2.5 mg Oral QODAY  . methylPREDNISolone sodium succinate  60 mg Intravenous Q8H  . pantoprazole  40 mg Oral Q1200  . sodium chloride  3 mL Intravenous Q12H  . DISCONTD: azithromycin  500 mg Intravenous Q24H  . DISCONTD:  enoxaparin (LOVENOX) injection  1.5 mg/kg Subcutaneous Q24H   Continuous:  EAV:[id number], acetaminophen, alum & mag hydroxide-simeth, antiseptic oral rinse, guaiFENesin-dextromethorphan, HYDROcodone-acetaminophen, levalbuterol, LORazepam, meclizine, sodium chloride  Assesment: She is much improved. She does have acute on chronic respiratory failure but I think she's better. Her nutritional status is poor. Principal Problem:  *Respiratory failure, acute and chronic Active Problems:  COPD exacerbation  Tachycardia  Thyroid disorder  Hypoxia    Plan: I think it's okay for her to be off the BiPAP for an hour to at least and see how she does and then we'll try to get a blood gas and see where she    LOS: 5 days   Naomi Castrogiovanni L 11/05/2010, 9:04 AM

## 2010-11-06 LAB — CBC
HCT: 37.2 % (ref 36.0–46.0)
Hemoglobin: 11.4 g/dL — ABNORMAL LOW (ref 12.0–15.0)
MCV: 90.1 fL (ref 78.0–100.0)
RDW: 14 % (ref 11.5–15.5)
WBC: 10 10*3/uL (ref 4.0–10.5)

## 2010-11-06 LAB — DIFFERENTIAL
Basophils Absolute: 0 10*3/uL (ref 0.0–0.1)
Eosinophils Relative: 0 % (ref 0–5)
Lymphocytes Relative: 5 % — ABNORMAL LOW (ref 12–46)
Monocytes Absolute: 0.6 10*3/uL (ref 0.1–1.0)
Monocytes Relative: 6 % (ref 3–12)

## 2010-11-06 LAB — BASIC METABOLIC PANEL
BUN: 18 mg/dL (ref 6–23)
CO2: 44 mEq/L (ref 19–32)
Chloride: 99 mEq/L (ref 96–112)
Creatinine, Ser: <0.47 mg/dL — ABNORMAL LOW (ref 0.50–1.10)

## 2010-11-06 LAB — FOLATE RBC: RBC Folate: 557 ng/mL (ref >=366)

## 2010-11-06 NOTE — Progress Notes (Signed)
Report given to Berneice Heinrich RN. Pt is A&O and stable at this time. Transferred via wheelchair by nurse and NT with 3lpm. [place].

## 2010-11-06 NOTE — Progress Notes (Signed)
Subjective: She looks much better and more comfortable her. She was able to stay off of BiPAP  Objective: Vital signs in last 24 hours: Temp:  [97.9 F (36.6 C)-98.4 F (36.9 C)] 98.4 F (36.9 C) (10/04 0400) Pulse Rate:  [95-121] 102  (10/03 2300) Resp:  [16-25] 22  (10/03 2300) BP: (88-132)/(68-94) 132/76 mmHg (10/03 2300) SpO2:  [84 %-100 %] 100 % (10/04 0700) Weight:  [38.6 kg (85 lb 1.6 oz)] 85 lb 1.6 oz (38.6 kg) (10/04 0500) Weight change:  Last BM Date: 11/05/10  Intake/Output from previous day: 10/03 0701 - 10/04 0700 In: -  Out: 501 [Urine:500; Stool:1]  PHYSICAL EXAM General appearance: alert, cooperative and mild distress Resp: rhonchi bilaterally Cardio: regular rate and rhythm, S1, S2 normal, no murmur, click, rub or gallop GI: soft, non-tender; bowel sounds normal; no masses,  no organomegaly Extremities: extremities normal, atraumatic, no cyanosis or edema  Lab Results:    Basic Metabolic Panel:  Basename 11/06/10 0537 11/05/10 0458  NA 143 143  K 3.8 3.6  CL 99 98  CO2 44* 44*  GLUCOSE 124* 105*  BUN 18 19  CREATININE <0.47* <0.47*  CALCIUM 9.2 9.5  MG -- --  PHOS -- --   Liver Function Tests: No results found for this basename: AST:2,ALT:2,ALKPHOS:2,BILITOT:2,PROT:2,ALBUMIN:2 in the last 72 hours No results found for this basename: LIPASE:2,AMYLASE:2 in the last 72 hours No results found for this basename: AMMONIA:2 in the last 72 hours CBC:  Basename 11/06/10 0537 11/05/10 0458  WBC 10.0 8.3  NEUTROABS 8.9* --  HGB 11.4* 11.0*  HCT 37.2 36.2  MCV 90.1 90.7  PLT 279 268   Cardiac Enzymes: No results found for this basename: CKTOTAL:3,CKMB:3,CKMBINDEX:3,TROPONINI:3 in the last 72 hours BNP: No results found for this basename: POCBNP:3 in the last 72 hours D-Dimer: No results found for this basename: DDIMER:2 in the last 72 hours CBG: No results found for this basename: GLUCAP:6 in the last 72 hours Hemoglobin A1C: No results found  for this basename: HGBA1C in the last 72 hours Fasting Lipid Panel: No results found for this basename: CHOL,HDL,LDLCALC,TRIG,CHOLHDL,LDLDIRECT in the last 72 hours Thyroid Function Tests: No results found for this basename: TSH,T4TOTAL,FREET4,T3FREE,THYROIDAB in the last 72 hours Anemia Panel:  Basename 11/05/10 0925  VITAMINB12 653  FOLATE --  FERRITIN --  TIBC --  IRON --  RETICCTPCT --   Urine Drug Screen:  Alcohol Level: No results found for this basename: ETH:2 in the last 72 hours Urinalysis:  Misc. Labs:  ABGS  Basename 11/05/10 1615  PHART 7.388  PO2ART 89.9  TCO2 38.4  HCO3 42.3*   CULTURES Recent Results (from the past 240 hour(s))  URINE CULTURE     Status: Normal   Collection Time   10/31/10 11:37 AM      Component Value Range Status Comment   Specimen Description URINE, CLEAN CATCH   Final    Special Requests NONE   Final    Setup Time [id number]   Final    Colony Count 9,000 COLONIES/ML   Final    Culture INSIGNIFICANT GROWTH   Final    Report Status 11/02/2010 FINAL   Final   MRSA PCR SCREENING     Status: Normal   Collection Time   11/01/10 11:10 AM      Component Value Range Status Comment   MRSA by PCR NEGATIVE  NEGATIVE  Final    Studies/Results: No results found.  Medications:  Scheduled:   . calcium-vitamin D  1 tablet Oral Daily  . cefTRIAXone (ROCEPHIN) IV  1 g Intravenous Q24H  . cholecalciferol  1,000 Units Oral Daily  . enoxaparin (LOVENOX) injection  30 mg Subcutaneous Q24H  . feeding supplement  30 mL Oral TID WC  . feeding supplement  30 mL Oral TID WC  . Fluticasone-Salmeterol  1 puff Inhalation BID  . guaiFENesin  600 mg Oral BID  . ipratropium  0.5 mg Nebulization Q6H  . levalbuterol  0.63 mg Nebulization Q6H  . methimazole  2.5 mg Oral QODAY  . methylPREDNISolone sodium succinate  60 mg Intravenous Q8H  . pantoprazole  40 mg Oral Q1200  . sodium chloride  3 mL Intravenous Q12H  . sodium chloride      . DISCONTD:  azithromycin  500 mg Oral Daily   Continuous:  BJY:[id number], acetaminophen, alum & mag hydroxide-simeth, antiseptic oral rinse, guaiFENesin-dextromethorphan, HYDROcodone-acetaminophen, levalbuterol, LORazepam, meclizine, sodium chloride  Assesment: She has acute on chronic respiratory failure but is better. I don't think there's anything new to add at this point. I discussed her situation with the hospitalist attending and I do believe she could be transferred from the intensive care unit today. Principal Problem:  *Respiratory failure, acute and chronic Active Problems:  COPD exacerbation  Tachycardia  Thyroid disorder  Hypoxia  Protein-calorie malnutrition, severe  DVT prophylaxis    Plan: No change in her treatments she is overall much improved    LOS: 6 days   Clemmie Marxen L 11/06/2010, 8:18 AM

## 2010-11-06 NOTE — Progress Notes (Signed)
Physical Therapy Evaluation Patient Name: [name] OZHYQ'M Date: 11/06/2010 Time: 14:09-14:23 Charges: 1 EV  Problem List:  Patient Active Problem List  Diagnoses  . COPD exacerbation  . Tachycardia  . Thyroid disorder  . Hypoxia  . Respiratory failure, acute and chronic  . Protein-calorie malnutrition, severe  . DVT prophylaxis   Past Medical History:  Past Medical History  Diagnosis Date  . Arthritis   . Asthma   . COPD (chronic obstructive pulmonary disease)   . Thyroid disorder 10/31/2010   Past Surgical History:  Past Surgical History  Procedure Date  . Abdominal hysterectomy     Precautions/Restrictions    Prior Functioning  Home Living Type of Home: House Lives With: Alone Receives Help From: Family;Neighbor Home Layout: Two level Alternate Level Stairs-Rails:  (patient doesn't go upstairs. ) Home Access: Stairs to enter Entrance Stairs-Rails: Right;Left (holding onto the porch post.  ) Entrance Stairs-Number of Steps: 1 Bathroom Shower/Tub: Tub/shower unit;Curtain (patient does a sponge bath.  ) Bathroom Toilet: Standard Home Adaptive Equipment: Walker - rolling;Wheelchair - manual (home 2 L O2 [place]) Prior Function Level of Independence: Needs assistance with homemaking;Other (comment) (needs help with some household chores, neice/neighbor cook. 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 Patient  long sitting ) Transfers Transfers: Yes Sit to Stand: 4: Min assist Sit to Stand Details (indicate cue type and reason): min assist to stabilize trunk for balance.   Stand to Sit: 4: Min assist Stand to Sit Details: min assist to help control descent to sit.  Ambulation/Gait Ambulation/Gait: Yes Ambulation/Gait Assistance: 4: Min assist Ambulation/Gait Assistance Details (indicate cue type and reason): min assist to stabilize trunk for balance.   Ambulation Distance (Feet): 10 Feet Assistive device: 1 person hand held assist Gait Pattern: Shuffle;Trunk flexed    Exercise     End of Session PT - End of Session Equipment Utilized During Treatment: Other (comment) (2.5 L O2 [place]) Activity Tolerance: Treatment limited secondary to medical complications (Comment);Other (comment) (limited by 3-4/4 dyspnea/low O2 sats -with small mobility) Patient left: in bed;Other (comment) (seated EOB, bed alarm on, repiratory therapist in room.  ) Nurse Communication: Mobility status for ambulation;Other (comment) (d/c recommendations.  ) General Behavior During Session: Other (comment) (anxious when dyspnea increased.  ) PT Assessment/Plan/Recommendation PT Assessment Clinical Impression Statement: 75 y.o. female admitted to [facility] with tachycardia, respiratory distress, SOB dx with COPD exacerbation.  She presents with decreased strength, decreased mobility, balance, independence, severely deminished activity tolerance and endurance, increased DOE and decreased O2 sats even on 2.5 L O2 [place].  Patient would benefit from skilled PT to maximize independence, functional mobility training, and safety so that she may be able to return to independent living after extensive SNF level rehab.   PT Recommendation/Assessment: Patient will need skilled PT in the acute care venue PT Problem List: Decreased strength;Decreased activity tolerance;Decreased balance;Decreased mobility;Decreased coordination;Decreased knowledge of  use of DME;Cardiopulmonary status limiting activity PT Therapy Diagnosis : Difficulty walking;Abnormality of gait;Generalized weakness PT Plan PT Frequency: Min 3X/week PT Treatment/Interventions: DME  instruction;Gait training;Stair training;Functional mobility training;Therapeutic exercise;Balance training;Neuromuscular re-education;Other (comment) (energy conservation education. ) PT Recommendation Follow Up Recommendations: Skilled nursing facility;24 hour supervision/assistance Equipment Recommended: Defer to next venue;Other (comment) PT Goals  Acute Rehab PT Goals PT Goal Formulation: With patient Time For Goal Achievement: 2 weeks Pt will go Supine/Side to Sit: with modified independence Pt will go Sit to Supine/Side: with modified independence Pt will Transfer Sit to Stand/Stand to Sit: with supervision Pt will Transfer Bed to Chair/Chair to Bed: with supervision Pt will Ambulate: 16 - 50 feet;with supervision;with rolling walker Pt will Go Up / Down Stairs: 1-2 stairs;with supervision;with rail(s) [name], [name] 11/06/2010, 2:44 PM

## 2010-11-06 NOTE — Progress Notes (Signed)
CRITICAL VALUE ALERT  Critical value received:  CO2 44  Date of notification:  11/06/10  Time of notification:  0632  Critical value read back: yes  Nurse who received alert:  Bolivar Haw RN  MD notified (1st page):  Result same as prior results, Dr not notified. -Francie Massing RN  Time of first page:  n/a  MD notified (2nd page):n/a  Time of second page:n/a  Responding MD:  n/a  Time MD responded:  n/a

## 2010-11-06 NOTE — Progress Notes (Signed)
Subjective: Patient states she feels much better than she did yesterday however not yet completely back to baseline. Subjectively the patient appears comfortable sitting at the side of the bed. She has no excessive accessory muscle use and she does not seem to have any conversation dyspnea or increased work of breathing. Patient is asking when she can be discharged home. Patient does share that she has exertion with ADLs when she is at her baseline. Objective: Filed Vitals:   11/06/10 0016 11/06/10 0400 11/06/10 0500 11/06/10 0700  BP:      Pulse:      Temp:  98.4 F (36.9 C)    TempSrc:  Oral    Resp:      Height:      Weight:   38.6 kg (85 lb 1.6 oz)   SpO2: 100%   100%   Weight change:   Intake/Output Summary (Last 24 hours) at 11/06/10 0752 Last data filed at 11/05/10 1900  Gross per 24 hour  Intake      0 ml  Output    501 ml  Net   -501 ml    General: Alert, awake, oriented x3, in no acute distress.  HEENT: [place]/AT PEERL, EOMI Neck: Trachea midline,  no masses, no thyromegal,y no JVD, no carotid bruit OROPHARYNX:  Moist, No exudate/ erythema/lesions.  Heart: Regular rate and rhythm, without murmurs, rubs, gallops, PMI non-displaced, no heaves or thrills on palpation.  Lungs: Clear to auscultation, no wheezing or rhonchi noted. No increased vocal fremitus resonant to percussion. No increased work of breathing  Abdomen: Soft, nontender, nondistended, positive bowel sounds, no masses no hepatosplenomegaly noted..  Neuro: No focal neurological deficits noted cranial nerves II through XII grossly intact. DTRs 2+ bilaterally upper and lower extremities. Strength 5 out of 5 in bilateral upper and lower extremities. Musculoskeletal: No warm swelling or erythema around joints, no spinal tenderness noted. Psychiatric: Patient alert and oriented x3, good insight and cognition, good recent to remote recall. Lymph node survey: No cervical axillary or inguinal lymphadenopathy  noted.     Lab Results:  Basename 11/06/10 0537 11/05/10 0458  NA 143 143  K 3.8 3.6  CL 99 98  CO2 44* 44*  GLUCOSE 124* 105*  BUN 18 19  CREATININE <0.47* <0.47*  CALCIUM 9.2 9.5  MG -- --  PHOS -- --   No results found for this basename: AST:2,ALT:2,ALKPHOS:2,BILITOT:2,PROT:2,ALBUMIN:2 in the last 72 hours No results found for this basename: LIPASE:2,AMYLASE:2 in the last 72 hours  Basename 11/06/10 0537 11/05/10 0458  WBC 10.0 8.3  NEUTROABS 8.9* --  HGB 11.4* 11.0*  HCT 37.2 36.2  MCV 90.1 90.7  PLT 279 268   No results found for this basename: CKTOTAL:3,CKMB:3,CKMBINDEX:3,TROPONINI:3 in the last 72 hours No results found for this basename: POCBNP:3 in the last 72 hours No results found for this basename: DDIMER:2 in the last 72 hours No results found for this basename: HGBA1C:2 in the last 72 hours No results found for this basename: CHOL:2,HDL:2,LDLCALC:2,TRIG:2,CHOLHDL:2,LDLDIRECT:2 in the last 72 hours No results found for this basename: TSH,T4TOTAL,FREET3,T3FREE,THYROIDAB in the last 72 hours  Basename 11/05/10 0925  VITAMINB12 653  FOLATE --  FERRITIN --  TIBC --  IRON --  RETICCTPCT --    Micro Results: Recent Results (from the past 240 hour(s))  URINE CULTURE     Status: Normal   Collection Time   10/31/10 11:37 AM      Component Value Range Status Comment   Specimen Description URINE,  CLEAN CATCH   Final    Special Requests NONE   Final    Setup Time [id number]   Final    Colony Count 9,000 COLONIES/ML   Final    Culture INSIGNIFICANT GROWTH   Final    Report Status 11/02/2010 FINAL   Final   MRSA PCR SCREENING     Status: Normal   Collection Time   11/01/10 11:10 AM      Component Value Range Status Comment   MRSA by PCR NEGATIVE  NEGATIVE  Final     Studies/Results: Dg Chest 1 View  11/02/2010  *RADIOLOGY REPORT*  Clinical Data: Short of breath.  COPD.  CHEST - 1 VIEW  Comparison: 11/01/2010  Findings: Severe pulmonary emphysema is  again demonstrated.  No evidence of acute infiltrate or pleural effusion.  Mild left basilar scarring and old left rib fracture deformities are again seen.  Heart size is normal.  IMPRESSION: Severe pulmonary emphysema.  No acute findings.  Original Report Authenticated By: Danae Orleans, M.D.   Dg Chest Portable 1 View  11/03/2010  *RADIOLOGY REPORT*  Clinical Data: Respiratory failure.  PORTABLE CHEST - 1 VIEW  Comparison: 11/02/2010  Findings: There is hyperinflation of the lungs compatible with COPD.  Heart and mediastinal contours are within normal limits.  No focal opacities or effusions.  No acute bony abnormality.  IMPRESSION: Stable severe COPD.  No active disease.  Original Report Authenticated By: Cyndie Chime, M.D.   Dg Chest Portable 1 View  11/01/2010  *RADIOLOGY REPORT*  Clinical Data: 75 year old female with chest pain and shortness of breath.  PORTABLE CHEST - 1 VIEW  Comparison: 10/31/2010 and earlier.  Findings: Portable AP upright view 2100 hours.  Chronic pulmonary hyperinflation.  Cardiac size and mediastinal contours are within normal limits.  Visualized tracheal air column is within normal limits.  No pneumothorax, pulmonary edema, definite pleural effusion, or acute pulmonary opacity.  IMPRESSION: Chronic pulmonary hyperinflation. No superimposed acute findings are identified.  Original Report Authenticated By: Harley Hallmark, M.D.   Dg Chest Portable 1 View  10/31/2010  *RADIOLOGY REPORT*  Clinical Data: History of COPD, asthma, now with shortness of breath  PORTABLE CHEST - 1 VIEW  Comparison: 02/25/2007; 02/01/2007; 08/14/2006; chest CT - 02/01/2007  Findings: Unchanged cardiac silhouette and mediastinal contours with mild tortuosity of the descending thoracic aorta.  The lungs remain hyperinflated with flattening of the bilateral hemidiaphragms.  No new focal parenchymal opacities.  No definite pleural effusion or pneumothorax.  Unchanged bones.  IMPRESSION: Hyperinflated  lungs without acute cardiopulmonary disease.  Further evaluation may be performed with a PA and lateral chest radiograph as clinically indicated.  Original Report Authenticated By: Waynard Reeds, M.D.    Medications: I have reviewed the patient's current medications. Scheduled Meds:   . azithromycin  500 mg Oral Daily  . calcium-vitamin D  1 tablet Oral Daily  . cefTRIAXone (ROCEPHIN) IV  1 g Intravenous Q24H  . cholecalciferol  1,000 Units Oral Daily  . enoxaparin (LOVENOX) injection  30 mg Subcutaneous Q24H  . feeding supplement  30 mL Oral TID WC  . feeding supplement  30 mL Oral TID WC  . Fluticasone-Salmeterol  1 puff Inhalation BID  . guaiFENesin  600 mg Oral BID  . ipratropium  0.5 mg Nebulization Q6H  . levalbuterol  0.63 mg Nebulization Q6H  . methimazole  2.5 mg Oral QODAY  . methylPREDNISolone sodium succinate  60 mg Intravenous Q8H  . pantoprazole  40 mg Oral Q1200  . sodium chloride  3 mL Intravenous Q12H  . sodium chloride       Continuous Infusions:  PRN Meds:.acetaminophen, acetaminophen, alum & mag hydroxide-simeth, antiseptic oral rinse, guaiFENesin-dextromethorphan, HYDROcodone-acetaminophen, levalbuterol, LORazepam, meclizine, sodium chloride Assessment/Plan: Patient Active Hospital Problem List: Respiratory failure, acute and chronic (10/31/2010)   Assessment: The acute component of this patient's respiratory failure seems to be near resolution. Her work of breathing is markedly decreased today however I suspect that this patient does have significant exertional dyspnea but    Plan: The patient is presently still on Solu-Medrol we'll transition her to oral prednisone starting today. The patient will be continued on her nebulizer and her Advair. I will confer with Dr. Juanetta Gosling however I feel this patient can be transferred out of the ICU today.  COPD exacerbation (10/31/2010)   Assessment: See above    Plan: See above  Tachycardia (10/31/2010)   Assessment: This  patient still has a mild resting tachycardia however I think this is the baseline for this patient and likely potentiated by the use of beta agonists    Plan: No further intervention at this time.  Thyroid disorder (10/31/2010)   Assessment: Adequately adjusted    Plan: Continue Tapazole  Hypoxia (10/31/2010)   Assessment: Patient back to baseline of O2 requirement    Plan: No further  Protein-calorie malnutrition, severe (11/05/2010)   Assessment   Plan: We'll obtain nutrition consult  DVT prophylaxis (11/05/2010)   Assessment   Plan: Continue Lovenox Transferred to telemetry floor today thanks   LOS: 6 days

## 2010-11-06 NOTE — Plan of Care (Signed)
Problem: Phase I Progression Outcomes Goal: Progress activity as tolerated unless otherwise ordered Outcome: Completed/Met Date Met:  11/06/10 See PT evaluation and progress notes for details.    Goal: Discharge plan established Outcome: Progressing PT recommending SNF because patient is severely limited in her mobility by her breathing and dyspnea (10' max today).  She would not be able to function at home alone.

## 2010-11-07 DIAGNOSIS — E876 Hypokalemia: Secondary | ICD-10-CM | POA: Diagnosis not present

## 2010-11-07 DIAGNOSIS — R197 Diarrhea, unspecified: Secondary | ICD-10-CM | POA: Diagnosis not present

## 2010-11-07 LAB — BLOOD GAS, ARTERIAL
Acid-Base Excess: 3.3 — ABNORMAL HIGH
Bicarbonate: 28.7 — ABNORMAL HIGH
O2 Content: 2
O2 Saturation: 94.7
pO2, Arterial: 83.8

## 2010-11-07 LAB — BASIC METABOLIC PANEL
BUN: 17 mg/dL (ref 6–23)
BUN: 8
CO2: 41 mEq/L (ref 19–32)
CO2: 31
Calcium: 9.4 mg/dL (ref 8.4–10.5)
Calcium: 10.5
Chloride: 103
Creatinine, Ser: <0.47 mg/dL — ABNORMAL LOW (ref 0.50–1.10)
Creatinine, Ser: 0.63
Glucose, Bld: 151 mg/dL — ABNORMAL HIGH (ref 70–99)
Glucose, Bld: 97

## 2010-11-07 LAB — CLOSTRIDIUM DIFFICILE BY PCR: Toxigenic C. Difficile by PCR: NEGATIVE

## 2010-11-07 LAB — CBC
MCHC: 33
MCV: 84
RDW: 14.3

## 2010-11-07 LAB — POCT CARDIAC MARKERS
CKMB, poc: <1 — ABNORMAL LOW
Troponin i, poc: <0.05

## 2010-11-07 LAB — DIFFERENTIAL
Basophils Absolute: 0.1
Basophils Relative: 2 — ABNORMAL HIGH
Eosinophils Absolute: 0.4
Monocytes Relative: 5
Neutro Abs: 4.6
Neutrophils Relative %: 66

## 2010-11-07 MED ORDER — SIMETHICONE 80 MG PO CHEW
80.0000 mg | CHEWABLE_TABLET | Freq: Four times a day (QID) | ORAL | Status: DC | PRN
  Administered 2010-11-07 – 2010-11-08 (×2): 80 mg via ORAL
  Filled 2010-11-07 (×2): qty 1

## 2010-11-07 MED ORDER — METHYLPREDNISOLONE SODIUM SUCC 125 MG IJ SOLR
60.0000 mg | Freq: Two times a day (BID) | INTRAMUSCULAR | Status: DC
  Administered 2010-11-07 – 2010-11-08 (×2): 60 mg via INTRAVENOUS
  Filled 2010-11-07 (×2): qty 2

## 2010-11-07 NOTE — Progress Notes (Signed)
Physical Therapy Treatment Patient Name: [name] Date: 11/07/2010  TIME: 930-950/ 1 Te 1 GT Problem List:  Patient Active Problem List  Diagnoses  . COPD exacerbation  . Tachycardia  . Thyroid disorder  . Hypoxia  . Respiratory failure, acute and chronic  . Protein-calorie malnutrition, severe  . DVT prophylaxis   Past Medical History:  Past Medical History  Diagnosis Date  . Arthritis   . Asthma   . COPD (chronic obstructive pulmonary disease)   . Thyroid disorder 10/31/2010   Past Surgical History:  Past Surgical History  Procedure Date  . Abdominal hysterectomy    Precautions/Restrictions  Restrictions Weight Bearing Restrictions: No Mobility (including Balance) Bed Mobility Bed Mobility: Yes Sitting - Scoot to Edge of Bed: 6: Modified independent (Device/Increase time) Sit to Supine - Left: 6: Modified independent (Device/Increase time) Transfers Transfers: Yes Sit to Stand: 5: Supervision Sit to Stand Details (indicate cue type and reason): VCs for hand placement Stand to Sit: 4: Min assist Stand to Sit Details: to control descent Stand Pivot Transfers: 5: Supervision Stand Pivot Transfer Details (indicate cue type and reason): Vcs to do lateral transfer rather than turning 360 degrees to avoid fall Ambulation/Gait Ambulation/Gait: Yes Ambulation/Gait Assistance: 4: Min assist Ambulation/Gait Assistance Details (indicate cue type and reason): Vcs to keep RW close and assistance to help steady Ambulation Distance (Feet): 13 Feet Assistive device: Rolling walker Gait velocity: slow;however increased when pt became anxious to sit Stairs: No Wheelchair Mobility Wheelchair Mobility: No    Exercise  Total Joint Exercises Long Arc Quad: Both;10 reps General Exercises - Lower Extremity Long Arc Quad: Both;10 reps Toe Raises: Both;20 reps Heel Raises: Both (20 reps) Other Exercises Other Exercises: sit<>stand (transfersx3) x5 to increase  endurance and functional indepence  End of Session PT - End of Session Equipment Utilized During Treatment:  (RW) Activity Tolerance: Patient limited by fatigue;Treatment limited secondary to medical complications (Comment) (Limited by low O2 sats and dyspnea) Patient left: in chair;with call bell in reach;with bed alarm set General Behavior During Session: [facility] for tasks performed Cognition: [facility] for tasks performed PT Assessment/Plan  PT - Assessment/Plan Comments on Treatment Session: Pt eager to participate in therapy;needed seated rest breaks after each individual exercise and gait due to shortness of breath and low O2 sats with 2/5 L of O2 84--89% duiring exercise and amb PT Goals  Acute Rehab PT Goals PT Goal: Supine/Side to Sit - Progress: Met PT Goal: Sit to Supine/Side - Progress: Met PT Goal: Ambulate - Progress: Not met  [name] 11/07/2010, 10:15 AM

## 2010-11-07 NOTE — Progress Notes (Signed)
Subjective: Patient sitting up in bed states she's feeling better today. Her only complaint is that she's having gas and belching which she states has been occurring for greater than 2. This has not however interfered with her ability to tolerate food or oral intake. The patient's also been having diarrhea she had 4 stools today. However the diarrhea has been occurring since Wednesday when she started taking in liquid supplementation. Objective: Filed Vitals:   11/07/10 0735 11/07/10 1359 11/07/10 1402 11/07/10 1500  BP:  137/80    Pulse:  90    Temp:  98.3 F (36.8 C)    TempSrc:      Resp:  19    Height:      Weight:      SpO2: 96% 99% 98% 97%   Weight change:   Intake/Output Summary (Last 24 hours) at 11/07/10 1613 Last data filed at 11/07/10 0900  Gross per 24 hour  Intake    600 ml  Output      0 ml  Net    600 ml    General: Alert, awake, oriented x3, in no acute distress. Chronically ill-appearing  HEENT: [place]/AT [place]ER[place], EOMI Neck: Trachea midline,  no masses, no thyromegal,y no JVD, no carotid bruit OROPHARYNX:  Moist, No exudate/ erythema/lesions.  Heart: Regular rate and rhythm, without murmurs, rubs, gallops, PMI non-displaced, no heaves or thrills on palpation.  Lungs: Clear to auscultation, no wheezing or rhonchi noted. No increased vocal fremitus resonant to percussion  Abdomen: Soft, nontender, nondistended, positive bowel sounds, no masses no hepatosplenomegaly noted..  Neuro: No focal neurological deficits noted cranial nerves II through XII grossly intact. DTRs 2+ bilaterally upper and lower extremities. Strength 5 out of 5 in bilateral upper and lower extremities. Musculoskeletal: No warm swelling or erythema around joints, no spinal tenderness noted. Psychiatric: Patient alert and oriented x3, good insight and cognition, good recent to remote recall. Lymph node survey: No cervical axillary or inguinal lymphadenopathy noted.     Lab Results:  Basename  11/07/10 0347 11/06/10 0537  NA 142 143  K 3.2* 3.8  CL 99 99  CO2 41* 44*  GLUCOSE 151* 124*  BUN 17 18  CREATININE <0.47* <0.47*  CALCIUM 9.4 9.2  MG -- --  PHOS -- --   No results found for this basename: AST:2,ALT:2,ALKPHOS:2,BILITOT:2,PROT:2,ALBUMIN:2 in the last 72 hours No results found for this basename: LIPASE:2,AMYLASE:2 in the last 72 hours  Basename 11/06/10 0537 11/05/10 0458  WBC 10.0 8.3  NEUTROABS 8.9* --  HGB 11.4* 11.0*  HCT 37.2 36.2  MCV 90.1 90.7  PLT 279 268   No results found for this basename: CKTOTAL:3,CKMB:3,CKMBINDEX:3,TROPONINI:3 in the last 72 hours No results found for this basename: POCBNP:3 in the last 72 hours No results found for this basename: DDIMER:2 in the last 72 hours No results found for this basename: HGBA1C:2 in the last 72 hours No results found for this basename: CHOL:2,HDL:2,LDLCALC:2,TRIG:2,CHOLHDL:2,LDLDIRECT:2 in the last 72 hours No results found for this basename: TSH,T4TOTAL,FREET3,T3FREE,THYROIDAB in the last 72 hours  Basename 11/05/10 0925  VITAMINB12 653  FOLATE --  FERRITIN --  TIBC --  IRON --  RETICCTPCT --    Micro Results: Recent Results (from the past 240 hour(s))  URINE CULTURE     Status: Normal   Collection Time   10/31/10 11:37 AM      Component Value Range Status Comment   Specimen Description URINE, CLEAN CATCH   Final    Special Requests NONE  Final    Setup Time [id number]   Final    Colony Count 9,000 COLONIES/ML   Final    Culture INSIGNIFICANT GROWTH   Final    Report Status 11/02/2010 FINAL   Final   MRSA PCR SCREENING     Status: Normal   Collection Time   11/01/10 11:10 AM      Component Value Range Status Comment   MRSA by PCR NEGATIVE  NEGATIVE  Final     Studies/Results: Dg Chest 1 View  11/02/2010  *RADIOLOGY REPORT*  Clinical Data: Short of breath.  COPD.  CHEST - 1 VIEW  Comparison: 11/01/2010  Findings: Severe pulmonary emphysema is again demonstrated.  No evidence of acute  infiltrate or pleural effusion.  Mild left basilar scarring and old left rib fracture deformities are again seen.  Heart size is normal.  IMPRESSION: Severe pulmonary emphysema.  No acute findings.  Original Report Authenticated By: Danae Orleans, M.D.   Dg Chest Portable 1 View  11/03/2010  *RADIOLOGY REPORT*  Clinical Data: Respiratory failure.  PORTABLE CHEST - 1 VIEW  Comparison: 11/02/2010  Findings: There is hyperinflation of the lungs compatible with COPD.  Heart and mediastinal contours are within normal limits.  No focal opacities or effusions.  No acute bony abnormality.  IMPRESSION: Stable severe COPD.  No active disease.  Original Report Authenticated By: Cyndie Chime, M.D.   Dg Chest Portable 1 View  11/01/2010  *RADIOLOGY REPORT*  Clinical Data: 75 year old female with chest pain and shortness of breath.  PORTABLE CHEST - 1 VIEW  Comparison: 10/31/2010 and earlier.  Findings: Portable AP upright view 2100 hours.  Chronic pulmonary hyperinflation.  Cardiac size and mediastinal contours are within normal limits.  Visualized tracheal air column is within normal limits.  No pneumothorax, pulmonary edema, definite pleural effusion, or acute pulmonary opacity.  IMPRESSION: Chronic pulmonary hyperinflation. No superimposed acute findings are identified.  Original Report Authenticated By: Harley Hallmark, M.D.   Dg Chest Portable 1 View  10/31/2010  *RADIOLOGY REPORT*  Clinical Data: History of COPD, asthma, now with shortness of breath  PORTABLE CHEST - 1 VIEW  Comparison: 02/25/2007; 02/01/2007; 08/14/2006; chest CT - 02/01/2007  Findings: Unchanged cardiac silhouette and mediastinal contours with mild tortuosity of the descending thoracic aorta.  The lungs remain hyperinflated with flattening of the bilateral hemidiaphragms.  No new focal parenchymal opacities.  No definite pleural effusion or pneumothorax.  Unchanged bones.  IMPRESSION: Hyperinflated lungs without acute cardiopulmonary disease.   Further evaluation may be performed with a PA and lateral chest radiograph as clinically indicated.  Original Report Authenticated By: Waynard Reeds, M.D.    Medications: I have reviewed the patient's current medications. Scheduled Meds:   . calcium-vitamin D  1 tablet Oral Daily  . cholecalciferol  1,000 Units Oral Daily  . enoxaparin (LOVENOX) injection  30 mg Subcutaneous Q24H  . feeding supplement  30 mL Oral TID WC  . Fluticasone-Salmeterol  1 puff Inhalation BID  . guaiFENesin  600 mg Oral BID  . ipratropium  0.5 mg Nebulization Q6H  . levalbuterol  0.63 mg Nebulization Q6H  . methimazole  2.5 mg Oral QODAY  . methylPREDNISolone (SOLU-MEDROL) injection  60 mg Intravenous Q12H  . pantoprazole  40 mg Oral Q1200  . sodium chloride  3 mL Intravenous Q12H  . DISCONTD: cefTRIAXone (ROCEPHIN) IV  1 g Intravenous Q24H  . DISCONTD: feeding supplement  30 mL Oral TID WC  . DISCONTD: methylPREDNISolone sodium succinate  60 mg Intravenous Q8H   Continuous Infusions:  PRN Meds:.acetaminophen, acetaminophen, alum & mag hydroxide-simeth, antiseptic oral rinse, guaiFENesin-dextromethorphan, HYDROcodone-acetaminophen, levalbuterol, LORazepam, meclizine, sodium chloride Assessment/Plan: Patient Active Hospital Problem List: Respiratory failure, acute and chronic (10/31/2010)   Assessment: Patient is much improved from a respiratory standpoint. She is back to her baseline oxygen requirement and exhibits no increased work of breathing at this time.  COPD exacerbation (10/31/2010)   Assessment: The patient has been on IV Solu-Medrol due to the intolerance of prednisone. At this time and tapering her IV Solu-Medrol from every 8 hours to every 12 hours. Tachycardia (10/31/2010)   Assessment: Tachycardia has resolved  Thyroid disorder (10/31/2010)   Assessment: Continue Tapazole  Hypoxia (10/31/2010)   Assessment: Resolved patient back to her baseline of 2 L of oxygen    Protein-calorie malnutrition,  severe (11/05/2010) Diarrhea: The patient has been having diarrhea since she started taking protein supplements here in the hospital. However we will go ahead and check her stool for C. difficile PCR in light of her recent antibiotic use.   Assessment: This is a chronic problem and she's been evaluated by the by nutrition services  Disposition: The patient has now agreed to go to the skilled nursing facility for short-term rehabilitation. Skilled nursing facility has accepted the patient when she is medically stable for discharge.      LOS: 7 days

## 2010-11-07 NOTE — Progress Notes (Signed)
CRITICAL VALUE ALERT  Critical value received:  CO2 of  41  Date of notification:  11/07/10  Time of notification:  0510  Critical value read back:yes  Nurse who received alert:  Denton Ar  MD notified (1st page):  Nida  Time of first page:  0512  MD notified (2nd page):  Time of second page:  Responding MD:  nida  Time MD responded:  [phone]

## 2010-11-07 NOTE — Progress Notes (Signed)
Follow-Up- ADULT NUTRITION ASSESSMENT Date: 11/07/2010   Time: 1:37 PM Reason for Assessment: Pt appears severely malnourished  ASSESSMENT: Female 75 y.o.  Dx: Respiratory failure, acute and chronic   Past Medical History  Diagnosis Date  . Arthritis   . Asthma   . COPD (chronic obstructive pulmonary disease)   . Thyroid disorder 10/31/2010   Related Meds:Ht: 5\' 2"  (157.5 cm)  Wt: 85 lb 1.6 oz (38.6 kg)  Ideal Wt: 50.1 kg 54.6 kg % Ideal Wt: 85%  Usual Wt: 90-95# % Usual Wt: 94% Weight change:   Body mass index is 15.56 kg/(m^2).  Food/Nutrition Related Hx: Pt awake on BiPap. Respiratory therapy and nurse present. Pt has been very short of breath and unable to eat 0% po since admission. BiPap removed for trial of po at lunch but was only able to take pills no food. She is endentulous and her dentures are at home. Rec Mech Soft diet to promote decr work of chewing. Limited hx provided due to shortness of breath. Pt unable to participate in diet edu at this time.  She reports wt used to be in the 90's but is unable to recall when that was. I suspect severe malnutrition given her temporal wasting and inadequate oral intake r/t respiratory failure and incr energy expenditure due to incr work of breathing. Pt has allergy to iodine-therefore Ensure and Resource Breeze are contraindicated. Will add ProStat 30 ml TID and milk with meals. If pt unable to incr po's rec TF be initiated. 11-07-10 Pt transferred out of ICU. Receiving a Dys-3 with thin liquids. Her appetite is improved today she reports to have eaten most of her eggs and is taking her ProStat 30 ml with orange juice. At lunch she had >50% of salisbury steak and mac and cheese. She is working with PT and  we discussed the importance of adequate nutrition both protein and energy as part of her rehab process.    BMET    Component Value Date/Time   NA 142 11/07/2010 0347   K 3.2* 11/07/2010 0347   CL 99 11/07/2010 0347   CO2 41*  11/07/2010 0347   GLUCOSE 151* 11/07/2010 0347   BUN 17 11/07/2010 0347   CREATININE <0.47* 11/07/2010 0347   CALCIUM 9.4 11/07/2010 0347   GFRNONAA NOT CALCULATED 11/07/2010 0347   GFRAA NOT CALCULATED 11/07/2010 0347   CBC    Component Value Date/Time   WBC 10.0 11/06/2010 0537   RBC 4.13 11/06/2010 0537   HGB 11.4* 11/06/2010 0537   HCT 37.2 11/06/2010 0537   PLT 279 11/06/2010 0537   MCV 90.1 11/06/2010 0537   MCH 27.6 11/06/2010 0537   MCHC 30.6 11/06/2010 0537   RDW 14.0 11/06/2010 0537   LYMPHSABS 0.5* 11/06/2010 0537   MONOABS 0.6 11/06/2010 0537   EOSABS 0.0 11/06/2010 0537   BASOSABS 0.0 11/06/2010 0537   CBG (last 3)  No results found for this basename: GLUCAP:3 in the last 72 hours   Intake/Output Summary (Last 24 hours) at 11/07/10 1337 Last data filed at 11/07/10 0900  Gross per 24 hour  Intake    600 ml  Output      0 ml  Net    600 ml    Diet Order: Dysphagia-3 with thin liquids   Supplements/Tube Feeding:Prostat30 ml TID  IVF:    Estimated Nutritional Needs:   AVWU:9811-9147 Protein:65-75 grams Fluid:1.4-1.6 L/d  NUTRITION DIAGNOSIS: -Inadequate oral intake (NI-2.1).  Status: Ongoing  RELATED TO:  -respiratory failure  AS EVIDENCE BY:  -meal intake 0%, muscle loss observed  MONITORING/EVALUATION(Goals): -Pt will meet >75% of est energy and protein needs.  11-07-10  Goal not met, but inadequate oral intake improved.  -She will maintain wt without incr in fluid accumulation.  11-07-10   Unable to acess wt change at this time.  -Monitor po's, labs and wt changes.  11-07-10 Cont as noted  EDUCATION NEEDS: -Education not appropriate at this time. Will address with pt when she is more able to participate. 11-07-10 -Edu completed High Protein, High Calorie diet.  INTERVENTION: -Rec Downgrade diet to Mech Soft Heart Healthy.  -Consider Liberalizing diet if feasible to Regular to potentially improve acceptability. -Add milk with all meals (110 kcal, 8 gr  protein) each 8 oz -Add ProStat 30 ml TID (216 kcal, 45 grams protein) per day. -If pt unable to incr po's rec initiate continuous  TF via NG of Osm 1.5 start at 20ml adv 10 ml q 4 hr to goal of 40 ml/hr ( at goal- provides 1440 kcal, 60 gr protein, 732 ml water). 11-07-10 -Diet downgraded and liberalized -Cont ProStat and milk with all meals  Dietitian [phone]  DOCUMENTATION CODES Per approved criteria  -Severe malnutrition in the context of acute and chronic illness or injury    Francene Boyers 11/07/2010, 1:37 PM

## 2010-11-07 NOTE — Progress Notes (Signed)
Dr. Jerolyn Center notified of pts diarrhea today- x4 stools.

## 2010-11-07 NOTE — Consult Note (Signed)
CSW presented bed offers to pt and she chooses [facility].  Possible d/c tomorrow per MD to SNF.  Facility notified and agreeable to weekend d/c.    Karn Cassis

## 2010-11-08 LAB — BASIC METABOLIC PANEL
BUN: 16 mg/dL (ref 6–23)
CO2: 43 mEq/L (ref 19–32)
Calcium: 9.4 mg/dL (ref 8.4–10.5)
Chloride: 97 mEq/L (ref 96–112)
Creatinine, Ser: <0.47 mg/dL — ABNORMAL LOW (ref 0.50–1.10)
Glucose, Bld: 93 mg/dL (ref 70–99)

## 2010-11-08 MED ORDER — LEVALBUTEROL HCL 0.63 MG/3ML IN NEBU: 0.6300 mg | INHALATION_SOLUTION | Freq: Four times a day (QID) | RESPIRATORY_TRACT | Status: DC

## 2010-11-08 MED ORDER — HYDROCODONE-ACETAMINOPHEN 5-325 MG PO TABS: 1.0000 | ORAL_TABLET | ORAL | Status: AC | PRN

## 2010-11-08 MED ORDER — GUAIFENESIN-DM 100-10 MG/5ML PO SYRP: 5.0000 mL | ORAL_SOLUTION | ORAL | Status: AC | PRN

## 2010-11-08 MED ORDER — PANTOPRAZOLE SODIUM 40 MG PO TBEC: 40.0000 mg | DELAYED_RELEASE_TABLET | Freq: Every day | ORAL | Status: DC

## 2010-11-08 MED ORDER — IPRATROPIUM BROMIDE 0.02 % IN SOLN: 500.0000 ug | Freq: Four times a day (QID) | RESPIRATORY_TRACT | Status: DC

## 2010-11-08 MED ORDER — PRO-STAT SUGAR FREE PO LIQD: 30.0000 mL | Freq: Three times a day (TID) | ORAL | Status: DC

## 2010-11-08 MED ORDER — FLUTICASONE-SALMETEROL 250-50 MCG/DOSE IN AEPB: 1.0000 | INHALATION_SPRAY | Freq: Two times a day (BID) | RESPIRATORY_TRACT | Status: DC

## 2010-11-08 MED ORDER — PREDNISONE 20 MG PO TABS: ORAL_TABLET | ORAL | Status: DC

## 2010-11-08 NOTE — Progress Notes (Signed)
Critical CO2 of 43 Dr Karilyn Cota notified by [facility] text

## 2010-11-08 NOTE — Consults (Signed)
Pt to be D/Ced today to [facility].  CSW spoke with Pt and staff at facility.  Both are in agreement with D/C plan.  Pt to be transported by EMS. CSW will sign off at this time.

## 2010-11-08 NOTE — Discharge Summary (Addendum)
Physician Discharge Summary  Patient ID: [name] MRN: [id number] DOB/AGE: 1935-10-25 75 y.o. Primary Care Physician:BUTLER,CYNTHIA, DO, DO Admit date: 10/31/2010 Discharge date: 11/08/2010    Discharge Diagnoses:  1. Respiratory failure, acute on chronic secondary to exacerbation of COPD. Improved. 2. Hyperthyroidism, on Tapazole. 3. Protein calorie malnutrition. 4. Diarrhea, resolved. C. difficile toxin negative.   Current Discharge Medication List    START taking these medications   Details  feeding supplement (PRO-STAT SUGAR FREE 64) LIQD Take 30 mLs by mouth 3 (three) times daily with meals. Qty: 900 mL, Refills: 0    Fluticasone-Salmeterol (ADVAIR) 250-50 MCG/DOSE AEPB Inhale 1 puff into the lungs 2 (two) times daily. Qty: 60 each, Refills: 0    guaiFENesin-dextromethorphan (ROBITUSSIN DM) 100-10 MG/5ML syrup Take 5 mLs by mouth every 4 (four) hours as needed for cough. Qty: 118 mL, Refills: 0    HYDROcodone-acetaminophen (NORCO) 5-325 MG per tablet Take 1-2 tablets by mouth every 4 (four) hours as needed. Qty: 30 tablet, Refills: 0    ipratropium (ATROVENT) 0.02 % nebulizer solution Take 2.5 mLs (500 mcg total) by nebulization every 6 (six) hours. Qty: 75 mL, Refills: 0    levalbuterol (XOPENEX) 0.63 MG/3ML nebulizer solution Take 3 mLs (0.63 mg total) by nebulization every 6 (six) hours. Qty: 3 mL, Refills: 0    pantoprazole (PROTONIX) 40 MG tablet Take 1 tablet (40 mg total) by mouth daily at 12 noon. Qty: 30 tablet, Refills: 0           CONTINUE these medications which have NOT CHANGED   Details  Calcium Carbonate-Vitamin D (CALCIUM 600+D) 600-400 MG-UNIT per tablet Take 1 tablet by mouth daily.      cholecalciferol (VITAMIN D) 1000 UNITS tablet Take 1,000 Units by mouth daily.      meclizine (ANTIVERT) 25 MG tablet Take 25 mg by mouth 4 (four) times daily as needed. FOR NAUSEA      methimazole (TAPAZOLE) 5 MG tablet Take 2.5 mg by mouth every other  day. PATIENT CALLS THIS HER "THYROID PILL"       STOP taking these medications     albuterol (PROVENTIL HFA;VENTOLIN HFA) 108 (90 BASE) MCG/ACT inhaler         Discharged Condition: Improved and stable.    Consults: Pulmonology, Dr. Kari Baars.  Significant Diagnostic Studies: Dg Chest 1 View  11/02/2010  *RADIOLOGY REPORT*  Clinical Data: Short of breath.  COPD.  CHEST - 1 VIEW  Comparison: 11/01/2010  Findings: Severe pulmonary emphysema is again demonstrated.  No evidence of acute infiltrate or pleural effusion.  Mild left basilar scarring and old left rib fracture deformities are again seen.  Heart size is normal.  IMPRESSION: Severe pulmonary emphysema.  No acute findings.  Original Report Authenticated By: Danae Orleans, M.D.   Dg Chest Portable 1 View  11/03/2010  *RADIOLOGY REPORT*  Clinical Data: Respiratory failure.  PORTABLE CHEST - 1 VIEW  Comparison: 11/02/2010  Findings: There is hyperinflation of the lungs compatible with COPD.  Heart and mediastinal contours are within normal limits.  No focal opacities or effusions.  No acute bony abnormality.  IMPRESSION: Stable severe COPD.  No active disease.  Original Report Authenticated By: Cyndie Chime, M.D.   Dg Chest Portable 1 View  11/01/2010  *RADIOLOGY REPORT*  Clinical Data: 75 year old female with chest pain and shortness of breath.  PORTABLE CHEST - 1 VIEW  Comparison: 10/31/2010 and earlier.  Findings: Portable AP upright view 2100 hours.  Chronic pulmonary  hyperinflation.  Cardiac size and mediastinal contours are within normal limits.  Visualized tracheal air column is within normal limits.  No pneumothorax, pulmonary edema, definite pleural effusion, or acute pulmonary opacity.  IMPRESSION: Chronic pulmonary hyperinflation. No superimposed acute findings are identified.  Original Report Authenticated By: Harley Hallmark, M.D.   Dg Chest Portable 1 View  10/31/2010  *RADIOLOGY REPORT*  Clinical Data: History of  COPD, asthma, now with shortness of breath  PORTABLE CHEST - 1 VIEW  Comparison: 02/25/2007; 02/01/2007; 08/14/2006; chest CT - 02/01/2007  Findings: Unchanged cardiac silhouette and mediastinal contours with mild tortuosity of the descending thoracic aorta.  The lungs remain hyperinflated with flattening of the bilateral hemidiaphragms.  No new focal parenchymal opacities.  No definite pleural effusion or pneumothorax.  Unchanged bones.  IMPRESSION: Hyperinflated lungs without acute cardiopulmonary disease.  Further evaluation may be performed with a PA and lateral chest radiograph as clinically indicated.  Original Report Authenticated By: Waynard Reeds, M.D.    Lab Results: Results for orders placed during the hospital encounter of 10/31/10 (from the past 48 hour(s))  BASIC METABOLIC PANEL     Status: Abnormal   Collection Time   11/07/10  3:47 AM      Component Value Range Comment   Sodium 142  135 - 145 (mEq/L)    Potassium 3.2 (*) 3.5 - 5.1 (mEq/L)    Chloride 99  96 - 112 (mEq/L)    CO2 41 (*) 19 - 32 (mEq/L)    Glucose, Bld 151 (*) 70 - 99 (mg/dL)    BUN 17  6 - 23 (mg/dL)    Creatinine, Ser <4.09 (*) 0.50 - 1.10 (mg/dL)    Calcium 9.4  8.4 - 10.5 (mg/dL)    GFR calc non Af Amer NOT CALCULATED  >90 (mL/min)    GFR calc Af Amer NOT CALCULATED  >90 (mL/min)   CLOSTRIDIUM DIFFICILE BY PCR     Status: Normal   Collection Time   11/07/10  6:52 PM      Component Value Range Comment   C difficile by pcr NEGATIVE  NEGATIVE    BASIC METABOLIC PANEL     Status: Abnormal   Collection Time   11/08/10  6:17 AM      Component Value Range Comment   Sodium 142  135 - 145 (mEq/L)    Potassium 3.4 (*) 3.5 - 5.1 (mEq/L)    Chloride 97  96 - 112 (mEq/L)    CO2 43 (*) 19 - 32 (mEq/L)    Glucose, Bld 93  70 - 99 (mg/dL)    BUN 16  6 - 23 (mg/dL)    Creatinine, Ser <8.11 (*) 0.50 - 1.10 (mg/dL)    Calcium 9.4  8.4 - 10.5 (mg/dL)    GFR calc non Af Amer NOT CALCULATED  >90 (mL/min)    GFR calc Af  Amer NOT CALCULATED  >90 (mL/min)    Recent Results (from the past 240 hour(s))  URINE CULTURE     Status: Normal   Collection Time   10/31/10 11:37 AM      Component Value Range Status Comment   Specimen Description URINE, CLEAN CATCH   Final    Special Requests NONE   Final    Setup Time [id number]   Final    Colony Count 9,000 COLONIES/ML   Final    Culture INSIGNIFICANT GROWTH   Final    Report Status 11/02/2010 FINAL   Final   MRSA  PCR SCREENING     Status: Normal   Collection Time   11/01/10 11:10 AM      Component Value Range Status Comment   MRSA by PCR NEGATIVE  NEGATIVE  Final   CLOSTRIDIUM DIFFICILE BY PCR     Status: Normal   Collection Time   11/07/10  6:52 PM      Component Value Range Status Comment   C difficile by pcr NEGATIVE  NEGATIVE  Final      Hospital Course: This 75 year old lady, previous smoker but not for the last 6 years, presented to the hospital with acute respiratory failure. She is normally oxygen dependent and she had an exacerbation of her COPD. Unfortunately, the patient deteriorated soon after admission and had type II respiratory failure with hypercapnia to the point where she had to be transferred to the intensive care unit and placed on BiPAP. Fortunately she did not require intubation and mechanical ventilation. She did well with these measures and including intravenous steroids and antibiotics as well as bronchodilators. She made a slow improvement and was able to be transitioned to the regular medical floor. She now remains stable and appears to be back to her baseline but she is severely deconditioned. She was evaluated by physical therapy who felt that she would be helped by rehabilitation in a skilled nursing facility. She has no active signs of infection. She has no cough, fever and appears to be back at her baseline dyspnea.  Discharge Exam: Blood pressure 137/81, pulse 89, temperature 97.6 F (36.4 C), temperature source Oral, resp. rate 21,  height 5\' 2"  (1.575 m), weight 38.6 kg (85 lb 1.6 oz), SpO2 95.00%. She looks frail and cachectic. There is no peripheral central cyanosis. She is very kyphotic. Lung fields show poor entry throughout. There is no wheezing or crackles. There are no bronchial breathing. Heart sounds are present and normal without murmurs. Jugular venous pressure not raised. She is not clinically in heart failure. She is alert and orientated without any focal neuro signs.  Disposition: Skilled nursing facility at [facility]. She will need followup with pulmonology Dr. Juanetta Gosling in the next couple weeks.  Discharge Orders    Future Orders Please Complete By Expires   Diet - low sodium heart healthy      Increase activity slowly           Signed: Ahmeer Tuman C 11/08/2010, 11:35 AM

## 2010-11-18 LAB — BASIC METABOLIC PANEL
CO2: 29
Chloride: 105
Creatinine, Ser: 0.46
GFR calc Af Amer: >60
Sodium: 139

## 2010-11-18 LAB — CBC
Hemoglobin: 13.9
MCV: 81.8
RBC: 5.07
WBC: 6

## 2010-11-18 LAB — DIFFERENTIAL
Eosinophils Absolute: 0.4
Lymphs Abs: 1.4
Monocytes Absolute: 0.4
Monocytes Relative: 6
Neutro Abs: 3.8
Neutrophils Relative %: 63

## 2010-11-20 LAB — BASIC METABOLIC PANEL
BUN: 6
CO2: 31
Glucose, Bld: 95
Potassium: 3.7
Sodium: 137

## 2012-08-22 IMAGING — CR DG CHEST 1V PORT
1 series · 1 of 1 positions shown · non-contrast
Comparison: 10/31/2010 and earlier.

CLINICAL DATA: 75-year-old female with chest pain and shortness of
breath.

PORTABLE CHEST - 1 VIEW

[view not recorded]
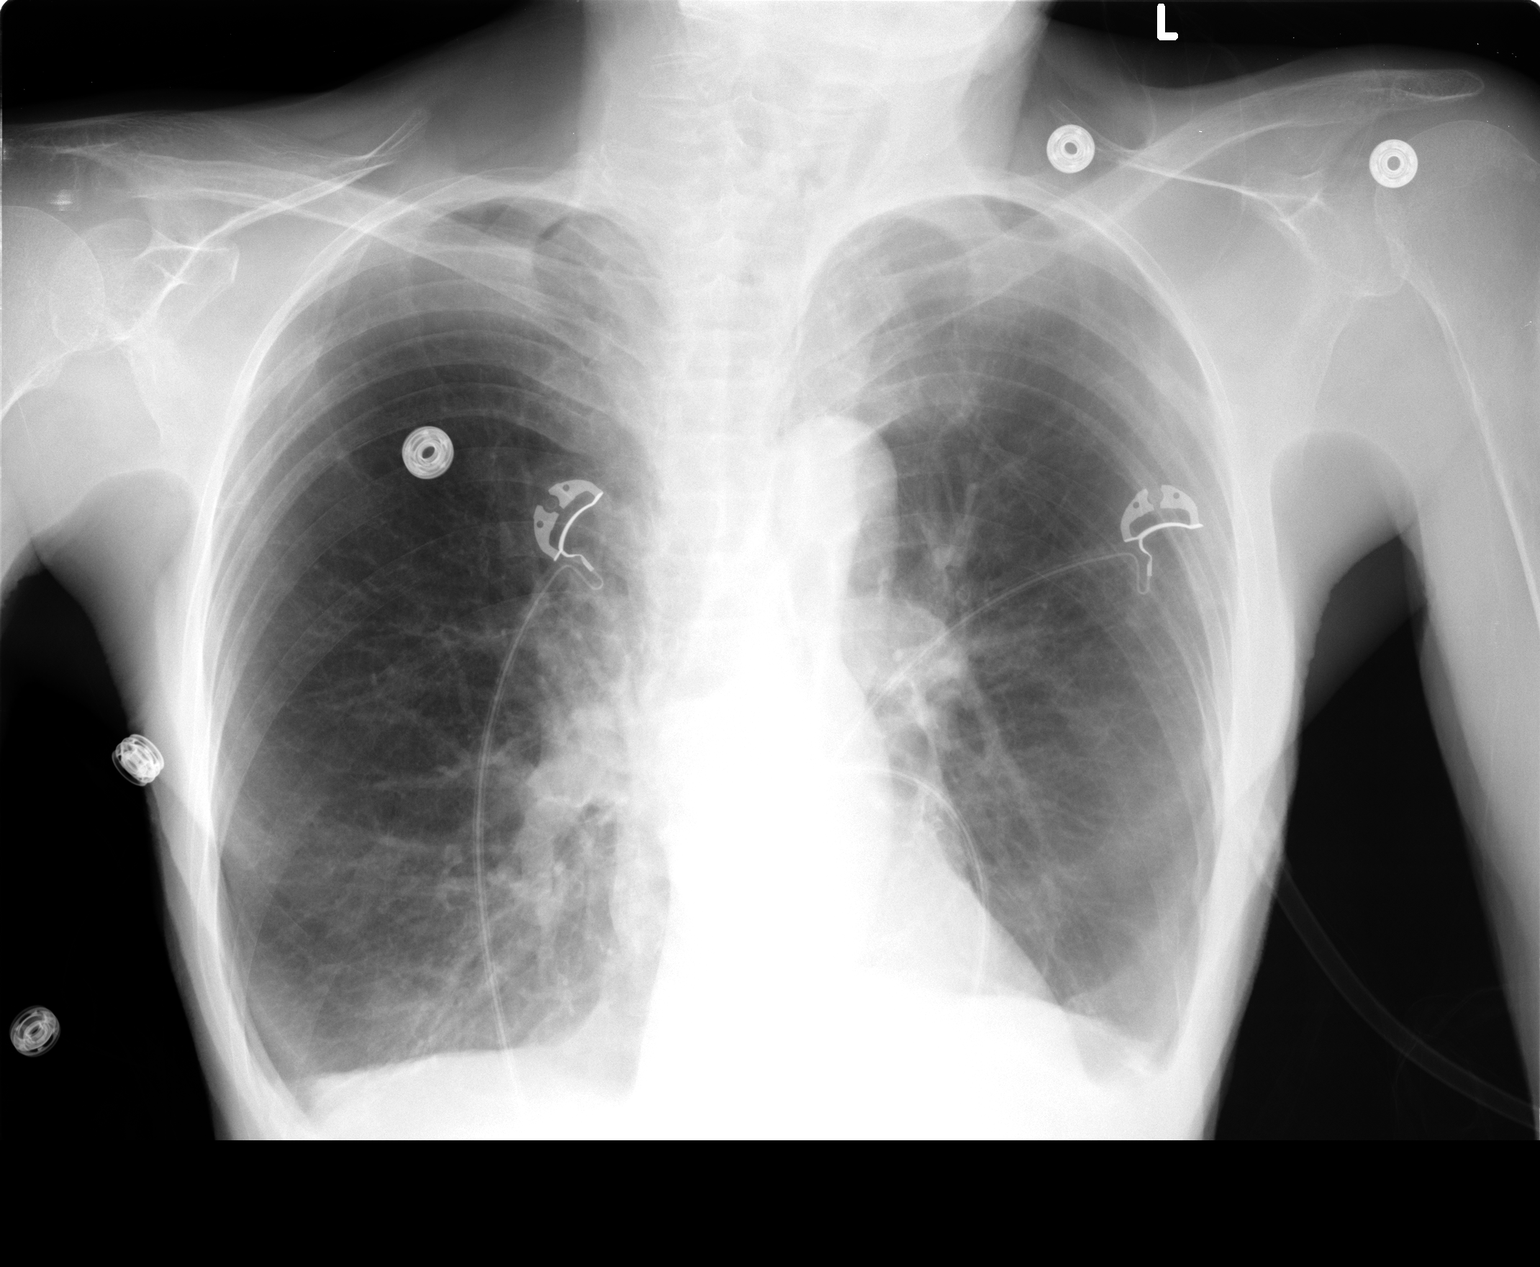

[1 of 1 positions shown; findings below may reference images not displayed]

FINDINGS: Portable AP upright view 9299 hours.  Chronic pulmonary
hyperinflation.  Cardiac size and mediastinal contours are within
normal limits.  Visualized tracheal air column is within normal
limits.  No pneumothorax, pulmonary edema, definite pleural
effusion, or acute pulmonary opacity.
IMPRESSION: Chronic pulmonary hyperinflation. No superimposed acute findings
are identified.

## 2012-08-23 IMAGING — CR DG CHEST 1V
1 series · 1 of 1 positions shown · non-contrast
Comparison: 11/01/2010

CLINICAL DATA: Short of breath.  COPD.

CHEST - 1 VIEW

[view not recorded]
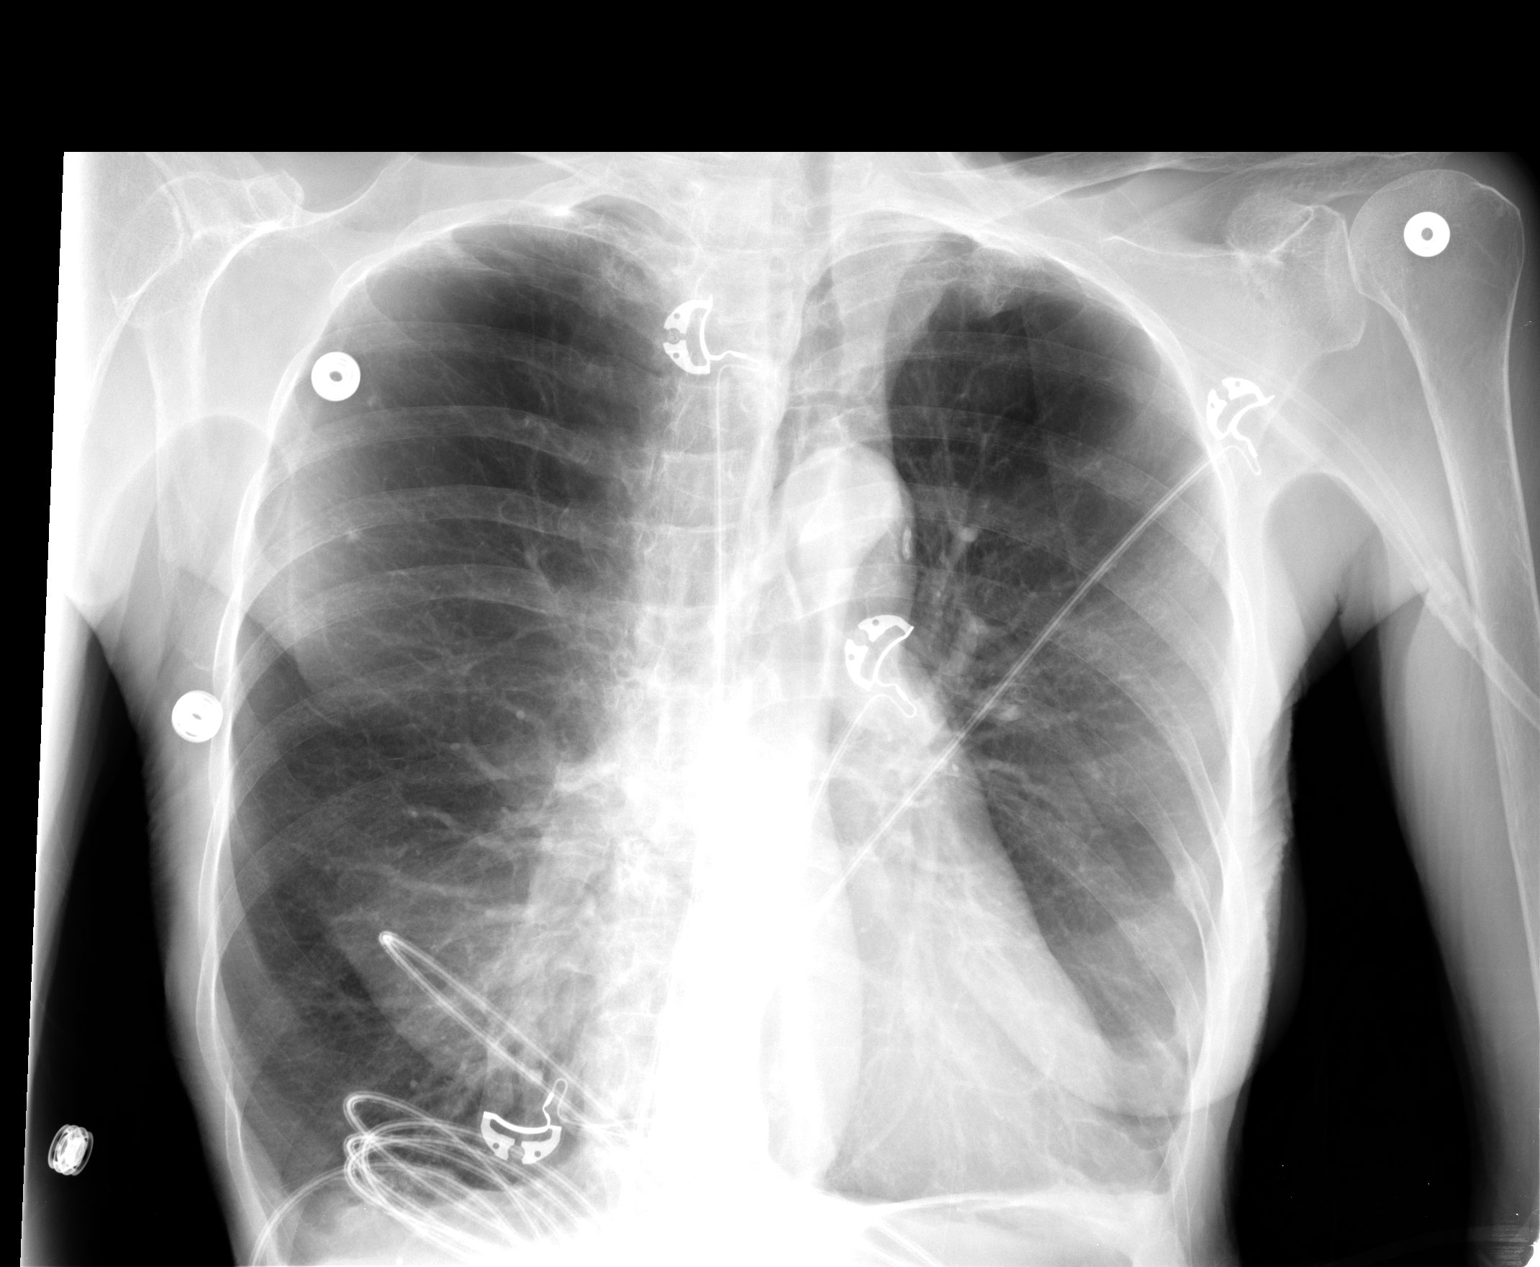

[1 of 1 positions shown; findings below may reference images not displayed]

FINDINGS: Severe pulmonary emphysema is again demonstrated.  No
evidence of acute infiltrate or pleural effusion.  Mild left
basilar scarring and old left rib fracture deformities are again
seen.  Heart size is normal.
IMPRESSION: Severe pulmonary emphysema.  No acute findings.

## 2013-02-28 ENCOUNTER — Emergency Department (HOSPITAL_COMMUNITY)
Admission: EM | Admit: 2013-02-28 | Discharge: 2013-02-28 | Disposition: A | Discharge status: Home / Self Care | Payer: Medicare Other | Attending: Emergency Medicine | Admitting: Emergency Medicine

## 2013-02-28 ENCOUNTER — Encounter (HOSPITAL_COMMUNITY): Payer: Self-pay | Admitting: Emergency Medicine

## 2013-02-28 ENCOUNTER — Emergency Department (HOSPITAL_COMMUNITY): Payer: Medicare Other

## 2013-02-28 DIAGNOSIS — R42 Dizziness and giddiness: Secondary | ICD-10-CM | POA: Insufficient documentation

## 2013-02-28 DIAGNOSIS — Z8739 Personal history of other diseases of the musculoskeletal system and connective tissue: Secondary | ICD-10-CM | POA: Insufficient documentation

## 2013-02-28 DIAGNOSIS — Z79899 Other long term (current) drug therapy: Secondary | ICD-10-CM | POA: Insufficient documentation

## 2013-02-28 DIAGNOSIS — J441 Chronic obstructive pulmonary disease with (acute) exacerbation: Secondary | ICD-10-CM | POA: Insufficient documentation

## 2013-02-28 DIAGNOSIS — J45901 Unspecified asthma with (acute) exacerbation: Secondary | ICD-10-CM

## 2013-02-28 DIAGNOSIS — R071 Chest pain on breathing: Secondary | ICD-10-CM | POA: Insufficient documentation

## 2013-02-28 DIAGNOSIS — R0789 Other chest pain: Secondary | ICD-10-CM

## 2013-02-28 DIAGNOSIS — E079 Disorder of thyroid, unspecified: Secondary | ICD-10-CM | POA: Insufficient documentation

## 2013-02-28 DIAGNOSIS — Z87891 Personal history of nicotine dependence: Secondary | ICD-10-CM | POA: Insufficient documentation

## 2013-02-28 DIAGNOSIS — Z91040 Latex allergy status: Secondary | ICD-10-CM | POA: Insufficient documentation

## 2013-02-28 LAB — CBC WITH DIFFERENTIAL/PLATELET
BASOS ABS: 0.1 10*3/uL (ref 0.0–0.1)
Basophils Relative: 1 % (ref 0–1)
Eosinophils Absolute: 0.3 10*3/uL (ref 0.0–0.7)
Eosinophils Relative: 3 % (ref 0–5)
HCT: 42.2 % (ref 36.0–46.0)
Hemoglobin: 13.6 g/dL (ref 12.0–15.0)
LYMPHS ABS: 2.6 10*3/uL (ref 0.7–4.0)
LYMPHS PCT: 24 % (ref 12–46)
MCH: 27.9 pg (ref 26.0–34.0)
MCHC: 32.2 g/dL (ref 30.0–36.0)
MCV: 86.5 fL (ref 78.0–100.0)
Monocytes Absolute: 0.7 10*3/uL (ref 0.1–1.0)
Monocytes Relative: 6 % (ref 3–12)
NEUTROS ABS: 7 10*3/uL (ref 1.7–7.7)
NEUTROS PCT: 65 % (ref 43–77)
PLATELETS: 308 10*3/uL (ref 150–400)
RBC: 4.88 MIL/uL (ref 3.87–5.11)
RDW: 14.2 % (ref 11.5–15.5)
WBC: 10.8 10*3/uL — AB (ref 4.0–10.5)

## 2013-02-28 LAB — COMPREHENSIVE METABOLIC PANEL
ALK PHOS: 88 U/L (ref 39–117)
ALT: 7 U/L (ref 0–35)
AST: 17 U/L (ref 0–37)
Albumin: 3.7 g/dL (ref 3.5–5.2)
BUN: 17 mg/dL (ref 6–23)
CHLORIDE: 100 meq/L (ref 96–112)
CO2: 31 meq/L (ref 19–32)
Calcium: 10.2 mg/dL (ref 8.4–10.5)
Creatinine, Ser: 0.56 mg/dL (ref 0.50–1.10)
GFR, EST NON AFRICAN AMERICAN: 89 mL/min — AB (ref >90)
GLUCOSE: 100 mg/dL — AB (ref 70–99)
POTASSIUM: 3.3 meq/L — AB (ref 3.7–5.3)
SODIUM: 144 meq/L (ref 137–147)
Total Bilirubin: 0.3 mg/dL (ref 0.3–1.2)
Total Protein: 7.6 g/dL (ref 6.0–8.3)

## 2013-02-28 LAB — MAGNESIUM: MAGNESIUM: 2 mg/dL (ref 1.5–2.5)

## 2013-02-28 LAB — PRO B NATRIURETIC PEPTIDE: PRO B NATRI PEPTIDE: 76.1 pg/mL (ref 0–450)

## 2013-02-28 LAB — D-DIMER, QUANTITATIVE: D-Dimer, Quant: 0.27 ug/mL-FEU (ref 0.00–0.48)

## 2013-02-28 LAB — TROPONIN I: Troponin I: <0.3 ng/mL (ref <0.30)

## 2013-02-28 MED ORDER — TRAMADOL HCL 50 MG PO TABS
50.0000 mg | ORAL_TABLET | Freq: Once | ORAL | Status: AC
  Administered 2013-02-28: 50 mg via ORAL
  Filled 2013-02-28: qty 1

## 2013-02-28 MED ORDER — TRAMADOL HCL 50 MG PO TABS: 50.0000 mg | ORAL_TABLET | Freq: Four times a day (QID) | ORAL | Status: DC | PRN

## 2013-02-28 MED ORDER — SODIUM CHLORIDE 0.9 % IV SOLN
INTRAVENOUS | Status: DC
  Administered 2013-02-28: 15:00:00 via INTRAVENOUS

## 2013-02-28 MED ORDER — ACETAMINOPHEN 500 MG PO TABS
500.0000 mg | ORAL_TABLET | Freq: Once | ORAL | Status: AC
  Administered 2013-02-28: 500 mg via ORAL
  Filled 2013-02-28: qty 1

## 2013-02-28 NOTE — ED Provider Notes (Signed)
CSN: [id number]     Arrival date & time 02/28/13  1253 History   First MD Initiated Contact with Patient 02/28/13 1301     Chief Complaint  Patient presents with  . Shortness of Breath   (Consider location/radiation/quality/duration/timing/severity/associated sxs/prior Treatment) HPI Patient reports this morning before daylight she started having pain in her left lower lateral chest that has spread up into her left arm, into her back, and into her low left shoulder and left shoulder blade. She can only describe the pain as "painful". It has been constant all day. She states she's never had it before. She states moving her arm makes the pain worse. She states she has had a mild cough since last week and is coughing up white mucus. She also is having white rhinorrhea. She did have some sneezing early on but gone now. She is unsure she had some fever last night but reports she felt hot. She denies sore throat, nausea, vomiting, or diarrhea. She states she's been short of breath "for a long time" but it has been worse the past 2-3 days. She also states she has noted a tremor in her right hand for the past 3 days that she thinks may be from using her inhaler, Ventolin. She denies any swelling or pain in her legs or difficulty walking. She states she did have some dizziness today like she might pass out.   PCP Dr Margo AyeHall Pulmonologist Dr Juanetta GoslingHawkins  Past Medical History  Diagnosis Date  . Arthritis   . Asthma   . COPD (chronic obstructive pulmonary disease)   . Thyroid disorder 10/31/2010   Past Surgical History  Procedure Laterality Date  . Abdominal hysterectomy     No family history on file. History  Substance Use Topics  . Smoking status: Former Games developermoker  . Smokeless tobacco: Not on file  . Alcohol Use: No  lives at home Lives alone Oxygen 2 lpm [place]    OB History   Grav Para Term Preterm Abortions TAB SAB Ect Mult Living                 Review of Systems  All other systems reviewed  and are negative.    Allergies  Food; Promist la; Sulfa antibiotics; Aspirin; Iodine; Latex; and Prednisone  Home Medications   Current Outpatient Rx  Name  Route  Sig  Dispense  Refill  . Calcium Carbonate-Vitamin D (CALCIUM 600+D) 600-400 MG-UNIT per tablet   Oral   Take 1 tablet by mouth daily.           . cholecalciferol (VITAMIN D) 1000 UNITS tablet   Oral   Take 1,000 Units by mouth daily.           . feeding supplement (PRO-STAT SUGAR FREE 64) LIQD   Oral   Take 30 mLs by mouth 3 (three) times daily with meals.   900 mL   0   . EXPIRED: ipratropium (ATROVENT) 0.02 % nebulizer solution   Nebulization   Take 2.5 mLs (500 mcg total) by nebulization every 6 (six) hours.   75 mL   0   . meclizine (ANTIVERT) 25 MG tablet   Oral   Take 25 mg by mouth 4 (four) times daily as needed. FOR NAUSEA           . methimazole (TAPAZOLE) 5 MG tablet   Oral   Take 2.5 mg by mouth every other day. PATIENT CALLS THIS HER "THYROID PILL"          .  EXPIRED: pantoprazole (PROTONIX) 40 MG tablet   Oral   Take 1 tablet (40 mg total) by mouth daily at 12 noon.   30 tablet   0    spiriva inhaler ? symbicort inhaler Ventolin inhaler   BP 116/61  Pulse 93  Temp(Src) 98.4 F (36.9 C) (Oral)  Resp 16  Ht 5\' 2"  (1.575 m)  Wt 86 lb (39.009 kg)  BMI 15.73 kg/m2  SpO2 100%  Vital signs normal   Physical Exam  Nursing note and vitals reviewed. Constitutional: She is oriented to person, place, and time.  Non-toxic appearance. She does not appear ill. No distress.  Frail elderly female  HENT:  Head: Normocephalic and atraumatic.  Right Ear: External ear normal.  Left Ear: External ear normal.  Nose: Nose normal. No mucosal edema or rhinorrhea.  Mouth/Throat: Oropharynx is clear and moist and mucous membranes are normal. No dental abscesses or uvula swelling.  Eyes: Conjunctivae and EOM are normal. Pupils are equal, round, and reactive to light.  Neck: Normal range  of motion and full passive range of motion without pain. Neck supple.  Cardiovascular: Normal rate, regular rhythm and normal heart sounds.  Exam reveals no gallop and no friction rub.   No murmur heard. Pulmonary/Chest: Effort normal and breath sounds normal. No respiratory distress. She has no wheezes. She has no rhonchi. She has no rales. She exhibits no tenderness and no crepitus.  Abdominal: Soft. Normal appearance and bowel sounds are normal. She exhibits no distension. There is no tenderness. There is no rebound and no guarding.  Musculoskeletal: Normal range of motion. She exhibits no edema and no tenderness.  Moves all extremities well.   Neurological: She is alert and oriented to person, place, and time. She has normal strength. No cranial nerve deficit.  Skin: Skin is warm, dry and intact. No rash noted. No erythema. No pallor.  Psychiatric: She has a normal mood and affect. Her speech is normal and behavior is normal. Her mood appears not anxious.    ED Course  Procedures (including critical care time)  Medications  0.9 %  sodium chloride infusion ( Intravenous New Bag/Given 02/28/13 1437)  traMADol (ULTRAM) tablet 50 mg (50 mg Oral Given 02/28/13 1448)  acetaminophen (TYLENOL) tablet 500 mg (500 mg Oral Given 02/28/13 1448)   15:15 feeling better, just got her medications. Niece at bedside  16:15 pain is gone, does not feel funny after medications  Labs Review Results for orders placed during the hospital encounter of 02/28/13  CBC WITH DIFFERENTIAL      Result Value Range   WBC 10.8 (*) 4.0 - 10.5 K/uL   RBC 4.88  3.87 - 5.11 MIL/uL   Hemoglobin 13.6  12.0 - 15.0 g/dL   HCT 16.1  09.6 - 04.5 %   MCV 86.5  78.0 - 100.0 fL   MCH 27.9  26.0 - 34.0 pg   MCHC 32.2  30.0 - 36.0 g/dL   RDW 40.9  81.1 - 91.4 %   Platelets 308  150 - 400 K/uL   Neutrophils Relative % 65  43 - 77 %   Neutro Abs 7.0  1.7 - 7.7 K/uL   Lymphocytes Relative 24  12 - 46 %   Lymphs Abs 2.6  0.7 -  4.0 K/uL   Monocytes Relative 6  3 - 12 %   Monocytes Absolute 0.7  0.1 - 1.0 K/uL   Eosinophils Relative 3  0 - 5 %   Eosinophils Absolute  0.3  0.0 - 0.7 K/uL   Basophils Relative 1  0 - 1 %   Basophils Absolute 0.1  0.0 - 0.1 K/uL  COMPREHENSIVE METABOLIC PANEL      Result Value Range   Sodium 144  137 - 147 mEq/L   Potassium 3.3 (*) 3.7 - 5.3 mEq/L   Chloride 100  96 - 112 mEq/L   CO2 31  19 - 32 mEq/L   Glucose, Bld 100 (*) 70 - 99 mg/dL   BUN 17  6 - 23 mg/dL   Creatinine, Ser 1.61  0.50 - 1.10 mg/dL   Calcium 09.6  8.4 - 04.5 mg/dL   Total Protein 7.6  6.0 - 8.3 g/dL   Albumin 3.7  3.5 - 5.2 g/dL   AST 17  0 - 37 U/L   ALT 7  0 - 35 U/L   Alkaline Phosphatase 88  39 - 117 U/L   Total Bilirubin 0.3  0.3 - 1.2 mg/dL   GFR calc non Af Amer 89 (*) >90 mL/min   GFR calc Af Amer >90  >90 mL/min  TROPONIN I      Result Value Range   Troponin I <0.30  <0.30 ng/mL  D-DIMER, QUANTITATIVE      Result Value Range   D-Dimer, Quant 0.27  0.00 - 0.48 ug/mL-FEU  MAGNESIUM      Result Value Range   Magnesium 2.0  1.5 - 2.5 mg/dL  PRO B NATRIURETIC PEPTIDE      Result Value Range   Pro B Natriuretic peptide (BNP) 76.1  0 - 450 pg/mL   Laboratory interpretation all normal except mild leukocytosis, hypokalemia     Imaging Review Dg Chest 2 View  02/28/2013   CLINICAL DATA:  Back pain cough.  COPD.  EXAM: CHEST  2 VIEW  COMPARISON:  Single view of the chest 11/03/2010.  FINDINGS: The chest is markedly hyperexpanded consistent with severe emphysema. There is near inversion of the hemidiaphragms. No consolidative process, pneumothorax or effusion. Heart size is normal.  IMPRESSION: Severe appearing emphysema.  No acute disease.   Electronically Signed   By: Drusilla Kanner M.D.   On: 02/28/2013 13:59    EKG Interpretation    Date/Time:  Tuesday February 28 2013 13:11:48 EST Ventricular Rate:  107 PR Interval:  158 QRS Duration: 70 QT Interval:  322 QTC Calculation: 429 R  Axis:   46 Text Interpretation:  Sinus tachycardia Biatrial enlargement Anterior infarct (cited on or before 17-Jul-2006) Artifact When compared with ECG of 21-Nov-2009 11:19, Vent. rate has increased BY  35 BPM Confirmed by Natalia Wittmeyer  MD-I, Jas Betten ([id number]) on 02/28/2013 1:27:33 PM            MDM  patient has atypical left-sided chest pain. After reviewing her lab work she has no evidence of MI, PE, pneumonia, congestive heart failure. She has no trauma to suggest rib fracture. Her pain is improved her tramadol and she is being discharged.    1. Left-sided chest wall pain    New Prescriptions   TRAMADOL (ULTRAM) 50 MG TABLET    Take 1 tablet (50 mg total) by mouth every 6 (six) hours as needed for moderate pain.    Plan discharge  Devoria Albe, MD, Franz Dell, MD 02/28/13 [phone]

## 2013-02-28 NOTE — Discharge Instructions (Signed)
Take the tramadol with acetaminophen (tylenol) 500 mg every 6 hrs as needed for pain. Recheck if you get worse such as fever, struggling to breathe, sweats, uncontrolled vomiting.    Chest Wall Pain Chest wall pain is pain felt in or around the chest bones and muscles. It may take up to 6 weeks to get better. It may take longer if you are active. Chest wall pain can happen on its own. Other times, things like germs, injury, coughing, or exercise can cause the pain. HOME CARE   Avoid activities that make you tired or cause pain. Try not to use your chest, belly (abdominal), or side muscles. Do not use heavy weights.  Put ice on the sore area.  Put ice in a plastic bag.  Place a towel between your skin and the bag.  Leave the ice on for 15-20 minutes for the first 2 days.  Only take medicine as told by your doctor. GET HELP RIGHT AWAY IF:   You have more pain or are very uncomfortable.  You have a fever.  Your chest pain gets worse.  You have new problems.  You feel sick to your stomach (nauseous) or throw up (vomit).  You start to sweat or feel lightheaded.  You have a cough with mucus (phlegm).  You cough up blood. MAKE SURE YOU:   Understand these instructions.  Will watch your condition.  Will get help right away if you are not doing well or get worse. Document Released: 07/08/2007 Document Revised: 04/13/2011 Document Reviewed: 09/15/2010 [facility] Patient Information 2014 [place], [place].

## 2013-02-28 NOTE — ED Notes (Signed)
Pt c/o back pain and np cough today. O2 sats 82% at home increased to 100% after albuterol treatment. O2 100% on 2L [place] upon ed arrival.

## 2013-04-23 ENCOUNTER — Encounter (HOSPITAL_COMMUNITY): Payer: Self-pay | Admitting: Emergency Medicine

## 2013-04-23 ENCOUNTER — Emergency Department (HOSPITAL_COMMUNITY): Payer: Medicare Other

## 2013-04-23 ENCOUNTER — Emergency Department (HOSPITAL_COMMUNITY)
Admission: EM | Admit: 2013-04-23 | Discharge: 2013-04-24 | Disposition: A | Discharge status: Home / Self Care | Payer: Medicare Other | Source: Home / Self Care | Attending: Emergency Medicine | Admitting: Emergency Medicine

## 2013-04-23 DIAGNOSIS — Z87891 Personal history of nicotine dependence: Secondary | ICD-10-CM | POA: Insufficient documentation

## 2013-04-23 DIAGNOSIS — Z91040 Latex allergy status: Secondary | ICD-10-CM

## 2013-04-23 DIAGNOSIS — Z792 Long term (current) use of antibiotics: Secondary | ICD-10-CM | POA: Insufficient documentation

## 2013-04-23 DIAGNOSIS — J440 Chronic obstructive pulmonary disease with acute lower respiratory infection: Principal | ICD-10-CM

## 2013-04-23 DIAGNOSIS — E079 Disorder of thyroid, unspecified: Secondary | ICD-10-CM

## 2013-04-23 DIAGNOSIS — J45901 Unspecified asthma with (acute) exacerbation: Secondary | ICD-10-CM | POA: Insufficient documentation

## 2013-04-23 DIAGNOSIS — Z9089 Acquired absence of other organs: Secondary | ICD-10-CM

## 2013-04-23 DIAGNOSIS — Z8739 Personal history of other diseases of the musculoskeletal system and connective tissue: Secondary | ICD-10-CM

## 2013-04-23 DIAGNOSIS — Z79899 Other long term (current) drug therapy: Secondary | ICD-10-CM

## 2013-04-23 DIAGNOSIS — J209 Acute bronchitis, unspecified: Secondary | ICD-10-CM

## 2013-04-23 DIAGNOSIS — R109 Unspecified abdominal pain: Secondary | ICD-10-CM

## 2013-04-23 DIAGNOSIS — Z9981 Dependence on supplemental oxygen: Secondary | ICD-10-CM

## 2013-04-23 DIAGNOSIS — M4000 Postural kyphosis, site unspecified: Secondary | ICD-10-CM | POA: Insufficient documentation

## 2013-04-23 DIAGNOSIS — J441 Chronic obstructive pulmonary disease with (acute) exacerbation: Secondary | ICD-10-CM

## 2013-04-23 LAB — CBC WITH DIFFERENTIAL/PLATELET
BASOS PCT: 3 % — AB (ref 0–1)
Basophils Absolute: 0.2 10*3/uL — ABNORMAL HIGH (ref 0.0–0.1)
Eosinophils Absolute: 0.4 10*3/uL (ref 0.0–0.7)
Eosinophils Relative: 6 % — ABNORMAL HIGH (ref 0–5)
HEMATOCRIT: 42.2 % (ref 36.0–46.0)
Hemoglobin: 13 g/dL (ref 12.0–15.0)
Lymphocytes Relative: 27 % (ref 12–46)
Lymphs Abs: 1.7 10*3/uL (ref 0.7–4.0)
MCH: 27.2 pg (ref 26.0–34.0)
MCHC: 30.8 g/dL (ref 30.0–36.0)
MCV: 88.3 fL (ref 78.0–100.0)
MONO ABS: 0.4 10*3/uL (ref 0.1–1.0)
Monocytes Relative: 6 % (ref 3–12)
NEUTROS ABS: 3.8 10*3/uL (ref 1.7–7.7)
NEUTROS PCT: 59 % (ref 43–77)
PLATELETS: 309 10*3/uL (ref 150–400)
RBC: 4.78 MIL/uL (ref 3.87–5.11)
RDW: 14.2 % (ref 11.5–15.5)
WBC: 6.4 10*3/uL (ref 4.0–10.5)

## 2013-04-23 LAB — BASIC METABOLIC PANEL
BUN: 14 mg/dL (ref 6–23)
CO2: 37 mEq/L — ABNORMAL HIGH (ref 19–32)
CREATININE: 0.83 mg/dL (ref 0.50–1.10)
Calcium: 10.1 mg/dL (ref 8.4–10.5)
Chloride: 103 mEq/L (ref 96–112)
GFR calc non Af Amer: 67 mL/min — ABNORMAL LOW (ref >90)
GFR, EST AFRICAN AMERICAN: 78 mL/min — AB (ref >90)
Glucose, Bld: 98 mg/dL (ref 70–99)
Potassium: 4 mEq/L (ref 3.7–5.3)
SODIUM: 146 meq/L (ref 137–147)

## 2013-04-23 LAB — TROPONIN I

## 2013-04-23 MED ORDER — ALBUTEROL SULFATE HFA 108 (90 BASE) MCG/ACT IN AERS
2.0000 | INHALATION_SPRAY | RESPIRATORY_TRACT | Status: DC | PRN
  Filled 2013-04-23: qty 6.7

## 2013-04-23 MED ORDER — IPRATROPIUM-ALBUTEROL 0.5-2.5 (3) MG/3ML IN SOLN
3.0000 mL | Freq: Once | RESPIRATORY_TRACT | Status: AC
  Administered 2013-04-23: 3 mL via RESPIRATORY_TRACT
  Filled 2013-04-23: qty 3

## 2013-04-23 MED ORDER — ALBUTEROL SULFATE HFA 108 (90 BASE) MCG/ACT IN AERS: 2.0000 | INHALATION_SPRAY | RESPIRATORY_TRACT | Status: DC | PRN

## 2013-04-23 MED ORDER — LEVOFLOXACIN 500 MG PO TABS: 500.0000 mg | ORAL_TABLET | Freq: Every day | ORAL | Status: DC

## 2013-04-23 MED ORDER — DEXAMETHASONE SODIUM PHOSPHATE 4 MG/ML IJ SOLN
10.0000 mg | Freq: Once | INTRAMUSCULAR | Status: AC
  Administered 2013-04-23: 10 mg via INTRAVENOUS
  Filled 2013-04-23: qty 3

## 2013-04-23 NOTE — ED Notes (Signed)
Ambulated pt around the nurses station on 2L of oxygen, pt O2 saturation went from 100% down to 98%.

## 2013-04-23 NOTE — ED Notes (Signed)
Pt from home with sob worsening over the past week, received an albuterol treatment en route by ems d/t wheezing.  Pt did not receive steroids due to allergy

## 2013-04-23 NOTE — ED Notes (Signed)
RESP. PAGED

## 2013-04-23 NOTE — ED Provider Notes (Signed)
CSN: [id number]     Arrival date & time 04/23/13  2046 History  This chart was scribed for Shon Baton, MD by Dorothey Baseman, ED Scribe. This patient was seen in room APA10/APA10 and the patient's care was started at 9:18 PM.    Chief Complaint  Patient presents with  . Shortness of Breath   The history is provided by the patient. No language interpreter was used.   HPI Comments: [name] is a 78 y.o. female with a history of asthma and COPD (patient wears 2 L of oxygen at home) who presents to the Emergency Department complaining of shortness of breath with an associated, dry cough that she states has been progressively worsening over the past week. Patient reports using breathing treatments at home with moderate, temporary relief but ran out of her inhaler yesterday. EMS administered a nebulizer en route to the ED, which she states has been able to provide some relief. She denies fever, chest pain, leg swelling. Patient reports allergies to sulfa antibiotics, aspirin, iodine, latex, and prednisone. Patient also has a history of arthritis and thyroid disorder.   Past Medical History  Diagnosis Date  . Arthritis   . Asthma   . COPD (chronic obstructive pulmonary disease)   . Thyroid disorder 10/31/2010   Past Surgical History  Procedure Laterality Date  . Abdominal hysterectomy     No family history on file. History  Substance Use Topics  . Smoking status: Former Games developer  . Smokeless tobacco: Not on file  . Alcohol Use: No   OB History   Grav Para Term Preterm Abortions TAB SAB Ect Mult Living                 Review of Systems  Constitutional: Negative for fever.  Respiratory: Positive for cough and shortness of breath.   Cardiovascular: Negative for chest pain and leg swelling.  Gastrointestinal: Positive for abdominal pain.  Musculoskeletal: Negative for back pain.  Skin: Negative for rash.  Neurological: Negative for headaches.  All other systems reviewed and are  negative.      Allergies  Food; Promist la; Sulfa antibiotics; Aspirin; Iodine; Latex; and Prednisone  Home Medications   Current Outpatient Rx  Name  Route  Sig  Dispense  Refill  . albuterol (PROVENTIL) (2.5 MG/3ML) 0.083% nebulizer solution   Nebulization   Take 2.5 mg by nebulization every 6 (six) hours as needed for wheezing or shortness of breath.         . cholecalciferol (VITAMIN D) 1000 UNITS tablet   Oral   Take 1,000 Units by mouth daily.           . methimazole (TAPAZOLE) 5 MG tablet   Oral   Take 2.5 mg by mouth every other day. PATIENT CALLS THIS HER "THYROID PILL"          . omeprazole (PRILOSEC) 20 MG capsule   Oral   Take 1 capsule by mouth daily.         . OXYGEN-HELIUM IN   Inhalation   Inhale 2 L into the lungs continuous.         . Simethicone (GAS RELIEF PO)   Oral   Take 1-2 tablets by mouth daily as needed (gas relief).          . VENTOLIN HFA 108 (90 BASE) MCG/ACT inhaler   Inhalation   Inhale 2 puffs into the lungs every 6 (six) hours as needed.         Marland Kitchen  albuterol (PROVENTIL HFA;VENTOLIN HFA) 108 (90 BASE) MCG/ACT inhaler   Inhalation   Inhale 2 puffs into the lungs every 4 (four) hours as needed for wheezing or shortness of breath.   1 Inhaler   0   . cephALEXin (KEFLEX) 500 MG capsule   Oral   Take 500 mg by mouth 3 (three) times daily. 10 day course starting on 3/2/2015COMPLETED COURSE         . levofloxacin (LEVAQUIN) 500 MG tablet   Oral   Take 1 tablet (500 mg total) by mouth daily.   7 tablet   0    Triage Vitals: BP 143/84  Temp(Src) 98.1 F (36.7 C) (Oral)  Resp 22  Ht 5\' 2"  (1.575 m)  Wt 86 lb (39.009 kg)  BMI 15.73 kg/m2  SpO2 98%  Physical Exam  Nursing note and vitals reviewed. Constitutional: She is oriented to person, place, and time. No distress.  Chronically ill-appearing  HENT:  Head: Normocephalic and atraumatic.  Mucous membranes dry  Neck: Neck supple.  Cardiovascular: Normal  rate, regular rhythm and normal heart sounds.   No murmur heard. Pulmonary/Chest: Effort normal. No respiratory distress. She has wheezes.  Fair air movement, scant expiratory wheezing noted  Abdominal: Soft. Bowel sounds are normal. There is no tenderness. There is no rebound.  Musculoskeletal:  Severe kyphosis of the upper thoracic spine  Neurological: She is alert and oriented to person, place, and time.  Skin: Skin is warm and dry.  Psychiatric: She has a normal mood and affect.    ED Course  Procedures (including critical care time)  DIAGNOSTIC STUDIES: Oxygen Saturation is 98% on nasal cannulation, normal by my interpretation.    COORDINATION OF CARE: 9:20 PM- Ordered an EKG, blood labs (CBC, BMP, troponin), and a chest x-ray. Will order a Duoneb breathing treatment and Decadron to manage symptoms.  Discussed treatment plan with patient at bedside and patient verbalized agreement.     Labs Review Labs Reviewed  CBC WITH DIFFERENTIAL - Abnormal; Notable for the following:    Eosinophils Relative 6 (*)    Basophils Relative 3 (*)    Basophils Absolute 0.2 (*)    All other components within normal limits  BASIC METABOLIC PANEL - Abnormal; Notable for the following:    CO2 37 (*)    GFR calc non Af Amer 67 (*)    GFR calc Af Amer 78 (*)    All other components within normal limits  TROPONIN I   Imaging Review Dg Chest Portable 1 View  04/23/2013   CLINICAL DATA:  COPD.  Shortness of breath.  EXAM: PORTABLE CHEST - 1 VIEW  COMPARISON:  Chest x-ray 02/28/2013.  FINDINGS: Lungs are hyperexpanded with flattening of the hemidiaphragms and pruning of the pulmonary vasculature in the periphery, compatible with advanced emphysema. No acute consolidative airspace disease. No pleural effusions. No evidence of pulmonary edema. Heart size is normal. Mediastinal contours are unremarkable. Atherosclerosis in the thoracic aorta.  IMPRESSION: 1. Advanced emphysematous changes redemonstrated,  as above, without radiographic evidence of acute cardiopulmonary disease.   Electronically Signed   By: Trudie Reed M.D.   On: 04/23/2013 21:54     EKG Interpretation   Date/Time:  Sunday April 23 2013 20:56:23 EDT Ventricular Rate:  108 PR Interval:  152 QRS Duration: 72 QT Interval:  316 QTC Calculation: 423 R Axis:   79 Text Interpretation:  Sinus tachycardia Cannot rule out Anterior infarct  (cited on or before 17-Jul-2006) Abnormal ECG When  compared with ECG of  28-Feb-2013 13:11, No significant change was found Confirmed by Gaelan Glennon   MD, Rohil Lesch ([id number]) on 04/23/2013 9:49:03 PM      MDM   Final diagnoses:  COPD (chronic obstructive pulmonary disease) with acute bronchitis    Patient presents with shortness of breath and wheezing. She is afebrile and chronically ill-appearing but nontoxic. Patient continues to have fair air movement with expiratory wheezing. Patient was given a duo neb. Patient states an intolerance to prednisone. Will give Decadron for longer lasting coverage.  Lab work shows no evidence of leukocytosis. BMP notable for CO2 of 37 which is in range for patient's baseline. On recheck, patient appears comfortable. She has improved air movement and only scant wheezing. Discuss with patient admission versus discharge home with antibiotics and albuterol refill. Patient would like to be discharged. She was able to ambulate and maintain pulse ox on home O2.  Patient will be given Levaquin and albuterol. She is intolerant to prednisone so was given Decadron which should cover her for a longer period of time. Patient is to followup with her primary care physician. She was given strict return preautions.  After history, exam, and medical workup I feel the patient has been appropriately medically screened and is safe for discharge home. Pertinent diagnoses were discussed with the patient. Patient was given return precautions.   I personally performed the services  described in this documentation, which was scribed in my presence. The recorded information has been reviewed and is accurate.     Shon Batonourtney F Dmitriy Gair, MD 04/24/13 1524

## 2013-04-24 NOTE — ED Notes (Signed)
Dr. Wilkie AyeHorton stated for patient to be transported to home on discharge by EMS due to patient requiring continuous oxygen and patient not having oxygen tanks with her in ED. Dr. Wilkie AyeHorton stated there was a medical necessity for her to be transported home via EMS. [facility] and here to [facility] patient home.

## 2013-04-24 NOTE — Discharge Instructions (Signed)
Chronic Obstructive Pulmonary Disease  Chronic obstructive pulmonary disease (COPD) is a common lung condition in which airflow from the lungs is limited. COPD is a general term that can be used to describe many different lung problems that limit airflow, including both chronic bronchitis and emphysema.  If you have COPD, your lung function will probably never return to normal, but there are measures you can take to improve lung function and make yourself feel better.   CAUSES   · Smoking (common).    · Exposure to secondhand smoke.    · Genetic problems.  · Chronic inflammatory lung diseases or recurrent infections.  SYMPTOMS   · Shortness of breath, especially with physical activity.    · Deep, persistent (chronic) cough with a large amount of thick mucus.    · Wheezing.    · Rapid breaths (tachypnea).    · Gray or bluish discoloration (cyanosis) of the skin, especially in fingers, toes, or lips.    · Fatigue.    · Weight loss.    · Frequent infections or episodes when breathing symptoms become much worse (exacerbations).    · Chest tightness.  DIAGNOSIS   Your healthcare provider will take a medical history and perform a physical examination to make the initial diagnosis.  Additional tests for COPD may include:   · Lung (pulmonary) function tests.  · Chest X-ray.  · CT scan.  · Blood tests.  TREATMENT   Treatment available to help you feel better when you have COPD include:   · Inhaler and nebulizer medicines. These help manage the symptoms of COPD and make your breathing more comfortable  · Supplemental oxygen. Supplemental oxygen is only helpful if you have a low oxygen level in your blood.    · Exercise and physical activity. These are beneficial for nearly all people with COPD. Some people may also benefit from a pulmonary rehabilitation program.  HOME CARE INSTRUCTIONS   · Take all medicines (inhaled or pills) as directed by your health care provider.  · Only take over-the-counter or prescription medicines  for pain, fever, or discomfort as directed by your health care provider.    · Avoid over-the-counter medicines or cough syrups that dry up your airway (such as antihistamines) and slow down the elimination of secretions unless instructed otherwise by your healthcare provider.    · If you are a smoker, the most important thing that you can do is stop smoking. Continuing to smoke will cause further lung damage and breathing trouble. Ask your health care provider for help with quitting smoking. He or she can direct you to community resources or hospitals that provide support.  · Avoid exposure to irritants such as smoke, chemicals, and fumes that aggravate your breathing.  · Use oxygen therapy and pulmonary rehabilitation if directed by your health care provider. If you require home oxygen therapy, ask your healthcare provider whether you should purchase a pulse oximeter to measure your oxygen level at home.    · Avoid contact with individuals who have a contagious illness.  · Avoid extreme temperature and humidity changes.  · Eat healthy foods. Eating smaller, more frequent meals and resting before meals may help you maintain your strength.  · Stay active, but balance activity with periods of rest. Exercise and physical activity will help you maintain your ability to do things you want to do.  · Preventing infection and hospitalization is very important when you have COPD. Make sure to receive all the vaccines your health care provider recommends, especially the pneumococcal and influenza vaccines. Ask your healthcare provider whether you   need a pneumonia vaccine.  · Learn and use relaxation techniques to manage stress.  · Learn and use controlled breathing techniques as directed by your health care provider. Controlled breathing techniques include:    · Pursed lip breathing. Start by breathing in (inhaling) through your nose for 1 second. Then, purse your lips as if you were going to whistle and breathe out (exhale)  through the pursed lips for 2 seconds.    · Diaphragmatic breathing. Start by putting one hand on your abdomen just above your waist. Inhale slowly through your nose. The hand on your abdomen should move out. Then purse your lips and exhale slowly. You should be able to feel the hand on your abdomen moving in as you exhale.    · Learn and use controlled coughing to clear mucus from your lungs. Controlled coughing is a series of short, progressive coughs. The steps of controlled coughing are:    1. Lean your head slightly forward.    2. Breathe in deeply using diaphragmatic breathing.    3. Try to hold your breath for 3 seconds.    4. Keep your mouth slightly open while coughing twice.    5. Spit any mucus out into a tissue.    6. Rest and repeat the steps once or twice as needed.  SEEK MEDICAL CARE IF:   · You are coughing up more mucus than usual.    · There is a change in the color or thickness of your mucus.    · Your breathing is more labored than usual.    · Your breathing is faster than usual.    SEEK IMMEDIATE MEDICAL CARE IF:   · You have shortness of breath while you are resting.    · You have shortness of breath that prevents you from:  · Being able to talk.    · Performing your usual physical activities.    · You have chest pain lasting longer than 5 minutes.    · Your skin color is more cyanotic than usual.  · You measure low oxygen saturations for longer than 5 minutes with a pulse oximeter.  MAKE SURE YOU:   · Understand these instructions.  · Will watch your condition.  · Will get help right away if you are not doing well or get worse.  Document Released: 10/29/2004 Document Revised: 11/09/2012 Document Reviewed: 09/15/2012  ExitCare® Patient Information ©2014 [employer], [employer].

## 2013-04-26 ENCOUNTER — Inpatient Hospital Stay (HOSPITAL_COMMUNITY)
Admission: EM | Admit: 2013-04-26 | Discharge: 2013-04-30 | DRG: 190 | Disposition: A | Discharge status: Home Health | Payer: Medicare Other | Attending: Internal Medicine | Admitting: Internal Medicine

## 2013-04-26 ENCOUNTER — Emergency Department (HOSPITAL_COMMUNITY): Payer: Medicare Other

## 2013-04-26 ENCOUNTER — Encounter (HOSPITAL_COMMUNITY): Payer: Self-pay | Admitting: Emergency Medicine

## 2013-04-26 DIAGNOSIS — I498 Other specified cardiac arrhythmias: Secondary | ICD-10-CM | POA: Diagnosis present

## 2013-04-26 DIAGNOSIS — R197 Diarrhea, unspecified: Secondary | ICD-10-CM

## 2013-04-26 DIAGNOSIS — Z87891 Personal history of nicotine dependence: Secondary | ICD-10-CM

## 2013-04-26 DIAGNOSIS — E079 Disorder of thyroid, unspecified: Secondary | ICD-10-CM

## 2013-04-26 DIAGNOSIS — E876 Hypokalemia: Secondary | ICD-10-CM

## 2013-04-26 DIAGNOSIS — E0590 Thyrotoxicosis, unspecified without thyrotoxic crisis or storm: Secondary | ICD-10-CM | POA: Diagnosis present

## 2013-04-26 DIAGNOSIS — E43 Unspecified severe protein-calorie malnutrition: Secondary | ICD-10-CM | POA: Diagnosis present

## 2013-04-26 DIAGNOSIS — M129 Arthropathy, unspecified: Secondary | ICD-10-CM | POA: Diagnosis present

## 2013-04-26 DIAGNOSIS — J441 Chronic obstructive pulmonary disease with (acute) exacerbation: Secondary | ICD-10-CM | POA: Diagnosis present

## 2013-04-26 DIAGNOSIS — J9620 Acute and chronic respiratory failure, unspecified whether with hypoxia or hypercapnia: Secondary | ICD-10-CM | POA: Diagnosis present

## 2013-04-26 DIAGNOSIS — R000 Tachycardia, unspecified: Secondary | ICD-10-CM | POA: Diagnosis present

## 2013-04-26 DIAGNOSIS — J45901 Unspecified asthma with (acute) exacerbation: Principal | ICD-10-CM

## 2013-04-26 DIAGNOSIS — Z299 Encounter for prophylactic measures, unspecified: Secondary | ICD-10-CM

## 2013-04-26 DIAGNOSIS — Z681 Body mass index (BMI) 19 or less, adult: Secondary | ICD-10-CM

## 2013-04-26 DIAGNOSIS — E860 Dehydration: Secondary | ICD-10-CM | POA: Diagnosis present

## 2013-04-26 DIAGNOSIS — R0902 Hypoxemia: Secondary | ICD-10-CM

## 2013-04-26 DIAGNOSIS — J449 Chronic obstructive pulmonary disease, unspecified: Secondary | ICD-10-CM

## 2013-04-26 LAB — BASIC METABOLIC PANEL
BUN: 26 mg/dL — ABNORMAL HIGH (ref 6–23)
CHLORIDE: 102 meq/L (ref 96–112)
CO2: 33 mEq/L — ABNORMAL HIGH (ref 19–32)
Calcium: 10.1 mg/dL (ref 8.4–10.5)
Creatinine, Ser: 0.72 mg/dL (ref 0.50–1.10)
GFR calc non Af Amer: 82 mL/min — ABNORMAL LOW (ref >90)
Glucose, Bld: 77 mg/dL (ref 70–99)
POTASSIUM: 4.2 meq/L (ref 3.7–5.3)
Sodium: 147 mEq/L (ref 137–147)

## 2013-04-26 LAB — CBC WITH DIFFERENTIAL/PLATELET
BASOS ABS: 0.2 10*3/uL — AB (ref 0.0–0.1)
Basophils Relative: 2 % — ABNORMAL HIGH (ref 0–1)
Eosinophils Absolute: 0.2 10*3/uL (ref 0.0–0.7)
Eosinophils Relative: 3 % (ref 0–5)
HCT: 42.4 % (ref 36.0–46.0)
HEMOGLOBIN: 13.5 g/dL (ref 12.0–15.0)
Lymphocytes Relative: 23 % (ref 12–46)
Lymphs Abs: 2 10*3/uL (ref 0.7–4.0)
MCH: 28.1 pg (ref 26.0–34.0)
MCHC: 31.8 g/dL (ref 30.0–36.0)
MCV: 88.3 fL (ref 78.0–100.0)
MONOS PCT: 4 % (ref 3–12)
Monocytes Absolute: 0.4 10*3/uL (ref 0.1–1.0)
NEUTROS ABS: 6.1 10*3/uL (ref 1.7–7.7)
NEUTROS PCT: 69 % (ref 43–77)
Platelets: 321 10*3/uL (ref 150–400)
RBC: 4.8 MIL/uL (ref 3.87–5.11)
RDW: 14.1 % (ref 11.5–15.5)
WBC: 8.9 10*3/uL (ref 4.0–10.5)

## 2013-04-26 LAB — PRO B NATRIURETIC PEPTIDE: PRO B NATRI PEPTIDE: 95.5 pg/mL (ref 0–450)

## 2013-04-26 LAB — TROPONIN I

## 2013-04-26 MED ORDER — METHYLPREDNISOLONE SODIUM SUCC 125 MG IJ SOLR
80.0000 mg | Freq: Once | INTRAMUSCULAR | Status: AC
  Administered 2013-04-26: 80 mg via INTRAVENOUS
  Filled 2013-04-26: qty 2

## 2013-04-26 MED ORDER — ONDANSETRON HCL 4 MG PO TABS: 4.0000 mg | ORAL_TABLET | Freq: Four times a day (QID) | ORAL | Status: DC | PRN

## 2013-04-26 MED ORDER — HEPARIN SODIUM (PORCINE) 5000 UNIT/ML IJ SOLN
5000.0000 [IU] | Freq: Three times a day (TID) | INTRAMUSCULAR | Status: DC
  Administered 2013-04-26 – 2013-04-29 (×9): 5000 [IU] via SUBCUTANEOUS
  Filled 2013-04-26 (×11): qty 1

## 2013-04-26 MED ORDER — LORAZEPAM 2 MG/ML IJ SOLN
0.5000 mg | Freq: Once | INTRAMUSCULAR | Status: AC
  Administered 2013-04-26: 0.5 mg via INTRAVENOUS
  Filled 2013-04-26: qty 1

## 2013-04-26 MED ORDER — ALBUTEROL SULFATE HFA 108 (90 BASE) MCG/ACT IN AERS
2.0000 | INHALATION_SPRAY | RESPIRATORY_TRACT | Status: DC | PRN
  Filled 2013-04-26: qty 6.7

## 2013-04-26 MED ORDER — ALBUTEROL (5 MG/ML) CONTINUOUS INHALATION SOLN
15.0000 mg/h | INHALATION_SOLUTION | Freq: Once | RESPIRATORY_TRACT | Status: AC
  Administered 2013-04-26: 15 mg/h via RESPIRATORY_TRACT
  Filled 2013-04-26: qty 20

## 2013-04-26 MED ORDER — ALBUTEROL SULFATE (2.5 MG/3ML) 0.083% IN NEBU
2.5000 mg | INHALATION_SOLUTION | Freq: Four times a day (QID) | RESPIRATORY_TRACT | Status: DC | PRN
  Administered 2013-04-26 – 2013-04-30 (×3): 2.5 mg via RESPIRATORY_TRACT
  Filled 2013-04-26 (×3): qty 3

## 2013-04-26 MED ORDER — PANTOPRAZOLE SODIUM 40 MG PO TBEC
40.0000 mg | DELAYED_RELEASE_TABLET | Freq: Every day | ORAL | Status: DC
  Administered 2013-04-26 – 2013-04-30 (×5): 40 mg via ORAL
  Filled 2013-04-26 (×5): qty 1

## 2013-04-26 MED ORDER — LEVOFLOXACIN 500 MG PO TABS
500.0000 mg | ORAL_TABLET | Freq: Every day | ORAL | Status: DC
  Administered 2013-04-26 – 2013-04-30 (×5): 500 mg via ORAL
  Filled 2013-04-26 (×5): qty 1

## 2013-04-26 MED ORDER — KETOROLAC TROMETHAMINE 0.5 % OP SOLN
1.0000 [drp] | Freq: Four times a day (QID) | OPHTHALMIC | Status: DC
  Administered 2013-04-26 – 2013-04-30 (×14): 1 [drp] via OPHTHALMIC
  Filled 2013-04-26: qty 5
  Filled 2013-04-26: qty 3

## 2013-04-26 MED ORDER — METHIMAZOLE 5 MG PO TABS
2.5000 mg | ORAL_TABLET | ORAL | Status: DC
  Administered 2013-04-28 – 2013-04-30 (×2): 2.5 mg via ORAL
  Filled 2013-04-26 (×3): qty 1

## 2013-04-26 MED ORDER — LEVALBUTEROL HCL 0.63 MG/3ML IN NEBU
0.6300 mg | INHALATION_SOLUTION | Freq: Four times a day (QID) | RESPIRATORY_TRACT | Status: DC
  Administered 2013-04-27 (×3): 0.63 mg via RESPIRATORY_TRACT
  Filled 2013-04-26 (×3): qty 3

## 2013-04-26 MED ORDER — ONDANSETRON HCL 4 MG/2ML IJ SOLN: 4.0000 mg | Freq: Four times a day (QID) | INTRAMUSCULAR | Status: DC | PRN

## 2013-04-26 MED ORDER — METHYLPREDNISOLONE SODIUM SUCC 125 MG IJ SOLR
80.0000 mg | Freq: Four times a day (QID) | INTRAMUSCULAR | Status: DC
  Administered 2013-04-26 – 2013-04-27 (×3): 80 mg via INTRAVENOUS
  Filled 2013-04-26 (×4): qty 2

## 2013-04-26 MED ORDER — LORAZEPAM 0.5 MG PO TABS
0.5000 mg | ORAL_TABLET | Freq: Two times a day (BID) | ORAL | Status: DC | PRN
  Administered 2013-04-28: 0.5 mg via ORAL
  Filled 2013-04-26 (×2): qty 1

## 2013-04-26 MED ORDER — IPRATROPIUM BROMIDE 0.02 % IN SOLN
0.5000 mg | Freq: Once | RESPIRATORY_TRACT | Status: AC
  Administered 2013-04-26: 0.5 mg via RESPIRATORY_TRACT
  Filled 2013-04-26: qty 2.5

## 2013-04-26 MED ORDER — KETOROLAC TROMETHAMINE 0.4 % OP SOLN: 1.0000 [drp] | Freq: Four times a day (QID) | OPHTHALMIC | Status: DC

## 2013-04-26 MED ORDER — VITAMIN D 1000 UNITS PO TABS
1000.0000 [IU] | ORAL_TABLET | Freq: Every day | ORAL | Status: DC
  Administered 2013-04-26 – 2013-04-30 (×5): 1000 [IU] via ORAL
  Filled 2013-04-26 (×8): qty 1

## 2013-04-26 MED ORDER — IPRATROPIUM BROMIDE 0.02 % IN SOLN
0.5000 mg | Freq: Four times a day (QID) | RESPIRATORY_TRACT | Status: DC
  Administered 2013-04-27 (×3): 0.5 mg via RESPIRATORY_TRACT
  Filled 2013-04-26 (×3): qty 2.5

## 2013-04-26 MED ORDER — SODIUM CHLORIDE 0.9 % IV SOLN
INTRAVENOUS | Status: DC
Start: 2013-04-26 — End: 2013-04-27
  Administered 2013-04-26: 18:00:00 via INTRAVENOUS

## 2013-04-26 NOTE — H&P (Signed)
[facility] History and Physical  Gena Frayellie M Najarro WJX:[id number]:[id number] DOB: 01/30/1936 DOA: 04/26/2013  Referring physician: ER. PCP: Catalina PizzaHALL, ZACH, MD   Chief Complaint: Dyspnea and cough.  HPI: Emily Judeth CornfieldM Mast is a 78 y.o. female  This is a 78 year old lady who is known COPD and presents with approximately one week history of dyspnea associated with a cough productive of white sputum. She was in the emergency room a few days ago and was sent home with oral steroids and antibiotics. She did well for the next one to 2 days but then she is now gotten worse again. She denies any fever. When she came to the emergency room, she was in respiratory distress with hypoxemia requiring increased oxygen. She is now being admitted for further treatment.   Review of Systems:  Constitutional:  No weight loss, night sweats, Fevers, chills, fatigue.  HEENT:  No headaches, Difficulty swallowing,Tooth/dental problems,Sore throat,  No sneezing, itching, ear ache, nasal congestion, post nasal drip,  Cardio-vascular:  No chest pain, Orthopnea, PND, swelling in lower extremities, anasarca, dizziness, palpitations  GI:  No heartburn, indigestion, abdominal pain, nausea, vomiting, diarrhea, change in bowel habits, loss of appetite   Skin:  no rash or lesions.  GU:  no dysuria, change in color of urine, no urgency or frequency. No flank pain.  Musculoskeletal:  No joint pain or swelling. No decreased range of motion. No back pain.  Psych:  No change in mood or affect. No depression or anxiety. No memory loss.   Past Medical History  Diagnosis Date  . Arthritis   . Asthma   . COPD (chronic obstructive pulmonary disease)   . Thyroid disorder 10/31/2010   Past Surgical History  Procedure Laterality Date  . Abdominal hysterectomy     Social History:  reports that she has quit smoking. She does not have any smokeless tobacco history on file. She reports that she does not drink alcohol or use illicit  drugs.  Allergies  Allergen Reactions  . Promist La [Pseudoephedrine-Guaifenesin] Shortness Of Breath  . Sulfa Antibiotics Nausea And Vomiting  . Aspirin Other (See Comments)    "FELT AS IF SHE WOULD DIE"  . Iodine Other (See Comments)    FELT "BAD" EXAUSTED   . Latex Itching    PLASTIC BANDAIDS  . Prednisone Palpitations    SPEEDS HEART RATE    No family history on file.   Prior to Admission medications   Medication Sig Start Date End Date Taking? Authorizing Provider  albuterol (PROVENTIL HFA;VENTOLIN HFA) 108 (90 BASE) MCG/ACT inhaler Inhale 2 puffs into the lungs every 4 (four) hours as needed for wheezing or shortness of breath. 04/23/13  Yes Shon Batonourtney F Horton, MD  albuterol (PROVENTIL) (2.5 MG/3ML) 0.083% nebulizer solution Take 2.5 mg by nebulization every 6 (six) hours as needed for wheezing or shortness of breath.   Yes Historical Provider, MD  cholecalciferol (VITAMIN D) 1000 UNITS tablet Take 1,000 Units by mouth daily.     Yes Historical Provider, MD  ketorolac (ACULAR) 0.4 % SOLN Place 1 drop into both eyes 4 (four) times daily.   Yes Historical Provider, MD  levofloxacin (LEVAQUIN) 500 MG tablet Take 1 tablet (500 mg total) by mouth daily. 04/23/13  Yes Shon Batonourtney F Horton, MD  methimazole (TAPAZOLE) 5 MG tablet Take 2.5 mg by mouth every other day.    Yes Historical Provider, MD  omeprazole (PRILOSEC) 20 MG capsule Take 1 capsule by mouth daily. 12/13/12  Yes Historical Provider, MD  Simethicone (  GAS RELIEF PO) Take 1-2 tablets by mouth daily as needed (gas relief).    Yes Historical Provider, MD  OXYGEN-HELIUM IN Inhale 2 L into the lungs continuous.    Historical Provider, MD   Physical Exam: Filed Vitals:   04/26/13 1500  BP: 120/94  Pulse: 131  Temp:   Resp: 19    BP 120/94  Pulse 131  Temp(Src) 98 F (36.7 C) (Oral)  Resp 19  Ht 5\' 1"  (1.549 m)  Wt 39.009 kg (86 lb)  BMI 16.26 kg/m2  SpO2 100%  General:  Appears calm and comfortable. She does not  appear to having increased work of breathing at rest. There is no peripheral central cyanosis. There is significant kyphosis which is likely affecting her respiratory status.  Eyes: PERRL, normal lids, irises & conjunctiva ENT: grossly normal hearing, lips & tongue Neck: no LAD, masses or thyromegaly Cardiovascular: RRR, no m/r/g. No LE edema. Telemetry: SR, no arrhythmias  Respiratory: Air entry is rather poor in both lung fields. She has kyphosis. There is no obvious wheezing at the present time after she has been in the emergency room for an hour or 2 receiving continuous nebulized treatment. Abdomen: soft, ntnd Skin: no rash or induration seen on limited exam Musculoskeletal: grossly normal tone BUE/BLE Psychiatric: grossly normal mood and affect, speech fluent and appropriate Neurologic: grossly non-focal.          Labs on Admission:  Basic Metabolic Panel:  Recent Labs Lab 04/23/13 2139 04/26/13 1503  NA 146 147  K 4.0 4.2  CL 103 102  CO2 37* 33*  GLUCOSE 98 77  BUN 14 26*  CREATININE 0.83 0.72  CALCIUM 10.1 10.1       CBC:  Recent Labs Lab 04/23/13 2139 04/26/13 1503  WBC 6.4 8.9  NEUTROABS 3.8 6.1  HGB 13.0 13.5  HCT 42.2 42.4  MCV 88.3 88.3  PLT 309 321   Cardiac Enzymes:  Recent Labs Lab 04/23/13 2139 04/26/13 1503  TROPONINI <0.30 <0.30    BNP (last 3 results)  Recent Labs  02/28/13 1335 04/26/13 1503  PROBNP 76.1 95.5   CBG: No results found for this basename: GLUCAP,  in the last 168 hours  Radiological Exams on Admission: Dg Chest Portable 1 View  04/26/2013   CLINICAL DATA:  Shortness of breath for 3-4 days.  Former smoker.  EXAM: PORTABLE CHEST - 1 VIEW  COMPARISON:  DG CHEST 1V PORT dated 04/23/2013  FINDINGS: Severe COPD. No active infiltrates or failure. Normal cardiomediastinal silhouette. Osseous demineralization. Chronic bilateral CP angle blunting.  IMPRESSION: Severe COPD, no active disease.   Electronically Signed   By:  Davonna Belling M.D.   On: 04/26/2013 15:10      Assessment/Plan   1. COPD exacerbation. 2. Resting tachycardia, sinus. 3. Clinical dehydration. 4. Hyperthyroidism on methimazole. I wonder if the sinus tachycardia represents hyperthyroidism.  Plan: 1. Admit to medical floor. 2. Intravenous steroids. 3. Continue oral antibiotics. 4. Check thyroid function.  Other recommendations will depend on patient's hospital progress.   Code Status: Full code for the time being.  Family Communication: Discussed plan with patient at the bedside.   Disposition Plan: Home when medically stable. She may require some home health services.   Time spent: 45 minutes.  Wilson Singer [facility]

## 2013-04-26 NOTE — ED Notes (Signed)
Patient's grandson left number to be called when discharged from hospital.  [phone] Renella CunasShane Johnson

## 2013-04-26 NOTE — ED Provider Notes (Signed)
CSN: [id number]     Arrival date & time 04/26/13  1427 History   First MD Initiated Contact with Patient 04/26/13 1432 This chart was scribed for Shon Baton, MD by Valera Castle, ED Scribe. This patient was seen in room APA06/APA06 and the patient's care was started at 2:33 PM.     Chief Complaint  Patient presents with  . Shortness of Breath   (Consider location/radiation/quality/duration/timing/severity/associated sxs/prior Treatment) HPI HPI Comments: [name] is a 78 y.o. female with h/o COPD, brought in by EMS who presents to the Emergency Department complaining of constant SOB, onset last night. Pt was seen 3 days ago for the same. She was given Levaquin, that pt states she has been taking. Patient states that her breathing got better but then got worse last night.  She states she has been using her inhaler at home without relief. She reports being on 2 L of O2 at home.She denies fever, chest pain, and any other associated symptoms. Patient states that he was noted by EMS did relieve her symptoms somewhat.  PCP - [name], MD  Past Medical History  Diagnosis Date  . Arthritis   . Asthma   . COPD (chronic obstructive pulmonary disease)   . Thyroid disorder 10/31/2010   Past Surgical History  Procedure Laterality Date  . Abdominal hysterectomy     No family history on file. History  Substance Use Topics  . Smoking status: Former Games developer  . Smokeless tobacco: Not on file  . Alcohol Use: No   OB History   Grav Para Term Preterm Abortions TAB SAB Ect Mult Living                 Review of Systems  Constitutional: Negative for fever.  Respiratory: Positive for shortness of breath. Negative for cough and chest tightness.   Cardiovascular: Negative for chest pain and leg swelling.  Gastrointestinal: Negative for nausea, vomiting and abdominal pain.  Skin: Negative for rash.  Neurological: Negative for headaches.  Psychiatric/Behavioral: Negative for confusion.   All other systems reviewed and are negative.      Allergies  Promist la; Sulfa antibiotics; Aspirin; Iodine; Latex; and Prednisone  Home Medications   Current Outpatient Rx  Name  Route  Sig  Dispense  Refill  . albuterol (PROVENTIL HFA;VENTOLIN HFA) 108 (90 BASE) MCG/ACT inhaler   Inhalation   Inhale 2 puffs into the lungs every 4 (four) hours as needed for wheezing or shortness of breath.   1 Inhaler   0   . albuterol (PROVENTIL) (2.5 MG/3ML) 0.083% nebulizer solution   Nebulization   Take 2.5 mg by nebulization every 6 (six) hours as needed for wheezing or shortness of breath.         . cholecalciferol (VITAMIN D) 1000 UNITS tablet   Oral   Take 1,000 Units by mouth daily.           Marland Kitchen ketorolac (ACULAR) 0.4 % SOLN   Both Eyes   Place 1 drop into both eyes 4 (four) times daily.         Marland Kitchen levofloxacin (LEVAQUIN) 500 MG tablet   Oral   Take 1 tablet (500 mg total) by mouth daily.   7 tablet   0   . methimazole (TAPAZOLE) 5 MG tablet   Oral   Take 2.5 mg by mouth every other day.          Marland Kitchen omeprazole (PRILOSEC) 20 MG capsule   Oral  Take 1 capsule by mouth daily.         . Simethicone (GAS RELIEF PO)   Oral   Take 1-2 tablets by mouth daily as needed (gas relief).          . OXYGEN-HELIUM IN   Inhalation   Inhale 2 L into the lungs continuous.          BP 120/94  Pulse 131  Temp(Src) 98 F (36.7 C) (Oral)  Resp 19  Ht 5\' 1"  (1.549 m)  Wt 86 lb (39.009 kg)  BMI 16.26 kg/m2  SpO2 100%  Physical Exam  Nursing note and vitals reviewed. Constitutional: She is oriented to person, place, and time.  Chronically ill-appearing, thin  HENT:  Head: Normocephalic and atraumatic.  Mucous membranes dry  Neck: Neck supple.  Cardiovascular: Regular rhythm and normal heart sounds.   No murmur heard. Tachycardia  Pulmonary/Chest: Effort normal. No respiratory distress. She has wheezes.  Poor air movement, scant wheezing noted, diminished  breath sounds bilateral upper lobes  Abdominal: Soft. Bowel sounds are normal. There is no tenderness. There is no rebound.  Musculoskeletal:  Severe kyphosis  Neurological: She is alert and oriented to person, place, and time.  Skin: Skin is warm and dry.  Psychiatric: She has a normal mood and affect.    ED Course  Procedures (including critical care time)  DIAGNOSTIC STUDIES: Oxygen Saturation is 100% on room air, normal by my interpretation.    COORDINATION OF CARE: 2:37 PM-Discussed treatment plan with pt at bedside and pt agreed to plan.   Results for orders placed during the hospital encounter of 04/26/13  CBC WITH DIFFERENTIAL      Result Value Ref Range   WBC 8.9  4.0 - 10.5 K/uL   RBC 4.80  3.87 - 5.11 MIL/uL   Hemoglobin 13.5  12.0 - 15.0 g/dL   HCT 16.1  09.6 - 04.5 %   MCV 88.3  78.0 - 100.0 fL   MCH 28.1  26.0 - 34.0 pg   MCHC 31.8  30.0 - 36.0 g/dL   RDW 40.9  81.1 - 91.4 %   Platelets 321  150 - 400 K/uL   Neutrophils Relative % 69  43 - 77 %   Neutro Abs 6.1  1.7 - 7.7 K/uL   Lymphocytes Relative 23  12 - 46 %   Lymphs Abs 2.0  0.7 - 4.0 K/uL   Monocytes Relative 4  3 - 12 %   Monocytes Absolute 0.4  0.1 - 1.0 K/uL   Eosinophils Relative 3  0 - 5 %   Eosinophils Absolute 0.2  0.0 - 0.7 K/uL   Basophils Relative 2 (*) 0 - 1 %   Basophils Absolute 0.2 (*) 0.0 - 0.1 K/uL  BASIC METABOLIC PANEL      Result Value Ref Range   Sodium 147  137 - 147 mEq/L   Potassium 4.2  3.7 - 5.3 mEq/L   Chloride 102  96 - 112 mEq/L   CO2 33 (*) 19 - 32 mEq/L   Glucose, Bld 77  70 - 99 mg/dL   BUN 26 (*) 6 - 23 mg/dL   Creatinine, Ser 7.82  0.50 - 1.10 mg/dL   Calcium 95.6  8.4 - 21.3 mg/dL   GFR calc non Af Amer 82 (*) >90 mL/min   GFR calc Af Amer >90  >90 mL/min  TROPONIN I      Result Value Ref Range   Troponin I <0.30  <  0.30 ng/mL  PRO B NATRIURETIC PEPTIDE      Result Value Ref Range   Pro B Natriuretic peptide (BNP) 95.5  0 - 450 pg/mL   Dg Chest Portable 1  View  04/26/2013   CLINICAL DATA:  Shortness of breath for 3-4 days.  Former smoker.  EXAM: PORTABLE CHEST - 1 VIEW  COMPARISON:  DG CHEST 1V PORT dated 04/23/2013  FINDINGS: Severe COPD. No active infiltrates or failure. Normal cardiomediastinal silhouette. Osseous demineralization. Chronic bilateral CP angle blunting.  IMPRESSION: Severe COPD, no active disease.   Electronically Signed   By: Davonna BellingJohn  Curnes M.D.   On: 04/26/2013 15:10    EKG Interpretation   Date/Time:  Wednesday April 26 2013 14:38:33 EDT Ventricular Rate:  130 PR Interval:  146 QRS Duration: 66 QT Interval:  278 QTC Calculation: 409 R Axis:   66 Text Interpretation:  Sinus tachycardia Possible Left atrial enlargement  Anterior infarct (cited on or before 17-Jul-2006) Abnormal ECG When  compared with ECG of 23-Apr-2013 20:56, No significant change was found  Confirmed by HORTON  MD, COURTNEY ([id number]) on 04/26/2013 4:13:29 PM     Medications  methylPREDNISolone sodium succinate (SOLU-MEDROL) 125 mg/2 mL injection 80 mg (80 mg Intravenous Given 04/26/13 1509)  albuterol (PROVENTIL,VENTOLIN) solution continuous neb (15 mg/hr Nebulization Given 04/26/13 1459)  ipratropium (ATROVENT) nebulizer solution 0.5 mg (0.5 mg Nebulization Given 04/26/13 1459)   MDM   Final diagnoses:  COPD with exacerbation    Patient presents with shortness of breath. She has diminished breath sounds with scant wheezing. She's currently on 4 L O2 to maintain oxygen saturations. Patient was seen 3 days ago with a similar presentation. She is intolerant to prednisone and was given Decadron at that time for a longer lasting steroid effect.  She reports taking antibiotics at home. Suspect Decadron as one often patient is having recurrent exacerbation. Patient was given a continuous DuoNeb. EKG is nonischemic and troponin and BNP are reassuring. Chest x-ray shows no evidence of pneumonia.  Given recurrence of exacerbation intolerance to steroids at home, will  admit for frequent nebs and IV steroids which she appears to tolerate.    I personally performed the services described in this documentation, which was scribed in my presence. The recorded information has been reviewed and is accurate.    Shon Batonourtney F Horton, MD 04/26/13 [phone]

## 2013-04-26 NOTE — ED Notes (Signed)
Patient arrives via EMS from home with c/o shortness of breath that started today. COPD. Here Sunday for same. Sent home with Levequin for seven days. States she has been taking this.

## 2013-04-26 NOTE — ED Notes (Signed)
Report given to floor

## 2013-04-27 DIAGNOSIS — J449 Chronic obstructive pulmonary disease, unspecified: Secondary | ICD-10-CM

## 2013-04-27 DIAGNOSIS — E43 Unspecified severe protein-calorie malnutrition: Secondary | ICD-10-CM

## 2013-04-27 LAB — CBC
HCT: 39.3 % (ref 36.0–46.0)
HEMOGLOBIN: 12.1 g/dL (ref 12.0–15.0)
MCH: 27.2 pg (ref 26.0–34.0)
MCHC: 30.8 g/dL (ref 30.0–36.0)
MCV: 88.3 fL (ref 78.0–100.0)
Platelets: 270 10*3/uL (ref 150–400)
RBC: 4.45 MIL/uL (ref 3.87–5.11)
RDW: 14.1 % (ref 11.5–15.5)
WBC: 3.7 10*3/uL — ABNORMAL LOW (ref 4.0–10.5)

## 2013-04-27 LAB — COMPREHENSIVE METABOLIC PANEL
ALT: 10 U/L (ref 0–35)
AST: 29 U/L (ref 0–37)
Albumin: 3.6 g/dL (ref 3.5–5.2)
Alkaline Phosphatase: 68 U/L (ref 39–117)
BUN: 32 mg/dL — ABNORMAL HIGH (ref 6–23)
CALCIUM: 9.8 mg/dL (ref 8.4–10.5)
CO2: 32 meq/L (ref 19–32)
CREATININE: 0.63 mg/dL (ref 0.50–1.10)
Chloride: 103 mEq/L (ref 96–112)
GFR, EST NON AFRICAN AMERICAN: 86 mL/min — AB (ref >90)
GLUCOSE: 92 mg/dL (ref 70–99)
Potassium: 4.3 mEq/L (ref 3.7–5.3)
Sodium: 148 mEq/L — ABNORMAL HIGH (ref 137–147)
Total Bilirubin: 0.2 mg/dL — ABNORMAL LOW (ref 0.3–1.2)
Total Protein: 6.7 g/dL (ref 6.0–8.3)

## 2013-04-27 LAB — TSH: TSH: 1.833 u[IU]/mL (ref 0.350–4.500)

## 2013-04-27 LAB — T4, FREE: FREE T4: 1.42 ng/dL (ref 0.80–1.80)

## 2013-04-27 LAB — T3, FREE: T3, Free: 2.6 pg/mL (ref 2.3–4.2)

## 2013-04-27 MED ORDER — PRO-STAT SUGAR FREE PO LIQD
30.0000 mL | Freq: Three times a day (TID) | ORAL | Status: DC
  Administered 2013-04-27 – 2013-04-30 (×7): 30 mL via ORAL
  Filled 2013-04-27 (×6): qty 30

## 2013-04-27 MED ORDER — METHYLPREDNISOLONE SODIUM SUCC 125 MG IJ SOLR
60.0000 mg | Freq: Four times a day (QID) | INTRAMUSCULAR | Status: DC
  Administered 2013-04-27 – 2013-04-28 (×4): 60 mg via INTRAVENOUS
  Filled 2013-04-27 (×3): qty 2

## 2013-04-27 MED ORDER — LEVALBUTEROL HCL 0.63 MG/3ML IN NEBU
0.6300 mg | INHALATION_SOLUTION | Freq: Four times a day (QID) | RESPIRATORY_TRACT | Status: DC
  Administered 2013-04-27 – 2013-04-29 (×5): 0.63 mg via RESPIRATORY_TRACT
  Filled 2013-04-27 (×6): qty 3

## 2013-04-27 MED ORDER — MENTHOL 3 MG MT LOZG
1.0000 | LOZENGE | OROMUCOSAL | Status: DC | PRN
  Administered 2013-04-27: 3 mg via ORAL
  Filled 2013-04-27: qty 9

## 2013-04-27 MED ORDER — IPRATROPIUM BROMIDE 0.02 % IN SOLN
0.5000 mg | Freq: Four times a day (QID) | RESPIRATORY_TRACT | Status: DC
  Administered 2013-04-27 – 2013-04-29 (×5): 0.5 mg via RESPIRATORY_TRACT
  Filled 2013-04-27 (×6): qty 2.5

## 2013-04-27 NOTE — Progress Notes (Addendum)
INITIAL NUTRITION ASSESSMENT  DOCUMENTATION CODES Per approved criteria  -Severe malnutrition in the context of chronic illness   Pt meets criteria for severe MALNUTRITION in the context of chronic illness as evidenced by severe muscle depletion, <75% energy intake x 1 month, 20% wt loss x 1 week.  INTERVENTION: 30 ml Prostat TID.  NUTRITION DIAGNOSIS: Inadequate oral intake related to decreased appetite as evidenced by minimal PO's, 20% wt loss x 3 days.   Goal: Pt will meet >90% of estimated nutritional needs  Monitor:  PO intake, labs, skin assessments, weight changes, I/O's  Reason for Assessment: low BMI  78 y.o. female  Admitting Dx: <principal problem not specified>  ASSESSMENT: Pt admitted with COPD exacerbation. RD pulled to chart due to low BMI.  Wt hx reveals UBW of 85# over the past 2-3 years. Wt hx also reveals a 17# (20%) wt loss x 3 days, which is clinically significant.  No PO data recorded at this time, but suspect poor po intake, due to minimal lunch tray completion (only a bite of cake was eaten), hx of weight loss, and COPD.  No family present at time of visit. Pt would not arouse to name being called. Noticed severe muscle and fat depletion on temple, orbital region, and clavicle.    Height: Ht Readings from Last 1 Encounters:  04/26/13 5\' 1"  (1.549 m)    Weight: Wt Readings from Last 1 Encounters:  04/26/13 79 lb 4.8 oz (35.97 kg)    Ideal Body Weight: 110#  % Ideal Body Weight: 72%  Wt Readings from Last 10 Encounters:  04/26/13 79 lb 4.8 oz (35.97 kg)  04/23/13 86 lb (39.009 kg)  02/28/13 86 lb (39.009 kg)  11/06/10 85 lb 1.6 oz (38.6 kg)    Usual Body Weight: 85#  % Usual Body Weight: 93%  BMI:  Body mass index is 14.99 kg/(m^2). Meets criteria for underweight.   Estimated Nutritional Needs: Kcal: [id number] daily Protein: 36-45 grams daily Fluid: 1.1-1.3 L daily  Skin: Intact  Diet Order: General  EDUCATION  NEEDS: -Education not appropriate at this time   Intake/Output Summary (Last 24 hours) at 04/27/13 1601 Last data filed at 04/27/13 0500  Gross per 24 hour  Intake  527.5 ml  Output      0 ml  Net  527.5 ml    Last BM: 04/26/13   Labs:   Recent Labs Lab 04/23/13 2139 04/26/13 1503 04/27/13 0447  NA 146 147 148*  K 4.0 4.2 4.3  CL 103 102 103  CO2 37* 33* 32  BUN 14 26* 32*  CREATININE 0.83 0.72 0.63  CALCIUM 10.1 10.1 9.8  GLUCOSE 98 77 92    CBG (last 3)  No results found for this basename: GLUCAP,  in the last 72 hours  Scheduled Meds: . cholecalciferol  1,000 Units Oral Daily  . heparin  5,000 Units Subcutaneous 3 times per day  . ipratropium  0.5 mg Nebulization QID  . ketorolac  1 drop Both Eyes QID  . levalbuterol  0.63 mg Nebulization QID  . levofloxacin  500 mg Oral Daily  . [START ON 04/28/2013] methimazole  2.5 mg Oral QODAY  . methylPREDNISolone (SOLU-MEDROL) injection  60 mg Intravenous Q6H  . pantoprazole  40 mg Oral Daily    Continuous Infusions:   Past Medical History  Diagnosis Date  . Arthritis   . Asthma   . COPD (chronic obstructive pulmonary disease)   . Thyroid disorder 10/31/2010  Past Surgical History  Procedure Laterality Date  . Abdominal hysterectomy      Shenee Wignall A. Mayford Knife, RD, LDN Pager: [phone]

## 2013-04-27 NOTE — Clinical Documentation Improvement (Signed)
Possible Clinical Conditions?  Acute Respiratory Failure Acute on Chronic Respiratory Failure Chronic Respiratory Failure Other Condition Cannot Clinically Determine   Supporting Information:  Risk Factors:  ED Provider note: presents to the Emergency Department complaining of constant SOB, onset last night on 2 L of O2 at home History of COPD Admitted w/ COPD exacerbation H&P:  When she came to the emergency room, she was in respiratory distress with hypoxemia requiring increased oxygen. She is now being admitted for further treatment.   Signs & Symptoms: Shortness of breath/resp distress  ED Provider:She has diminished breath sounds with scant wheezing. She's currently on 4 L O2 to maintain oxygen saturations  H&P:Respiratory: Air entry is rather poor in both lung fields  Treatment: Proventil neb q6h prn Proventil Continuous neb x 1 dose 3/25 Atrovent neb qid Atrovent neb x 1 dose 3/25 Xopenex neb qid Keep O2 sats >92% Pulse Ox check w/ VS  Thank You, Harless Littenebora T Takela Varden ,RN Clinical Documentation Specialist:  [phone]  [facility]- Health Information Management

## 2013-04-27 NOTE — Progress Notes (Signed)
[facility] PROGRESS NOTE  [name] ZOX:[id number] DOB: September 04, 1935 DOA: 04/26/2013 PCP: [name], MD  Assessment/Plan: 1. COPD Exacerbation -continue levaquin, IV solumedrol -albuterol, atrovent nebs -ambulate as tolerated -uses 2L Home O2 for COPD/Chronic resp failure  2. Tachycardia -due to B agonists/nebs -TSH/t4 normal  3. Severe malnutrition -RD consult  4. Hyperthyroidism -continue methimazole, TFTs normal  Code Status: Full Code Family Communication: none at bedside Disposition Plan: home when improved   HPI/Subjective: Still very dyspneic, slightly better  Objective: Filed Vitals:   04/27/13 0437  BP: 108/68  Pulse: 115  Temp: 97.5 F (36.4 C)  Resp: 22    Intake/Output Summary (Last 24 hours) at 04/27/13 1423 Last data filed at 04/27/13 0500  Gross per 24 hour  Intake  527.5 ml  Output      0 ml  Net  527.5 ml   Filed Weights   04/26/13 1436 04/26/13 1826  Weight: 39.009 kg (86 lb) 35.97 kg (79 lb 4.8 oz)    Exam:   General:  AAOx3, frail, emaciated female  Cardiovascular: S1S2/RRR  Respiratory: poor air movement, no wheezing  Abdomen: soft, NT, BS present  Musculoskeletal: no edema c/c  Data Reviewed: Basic Metabolic Panel:  Recent Labs Lab 04/23/13 2139 04/26/13 1503 04/27/13 0447  NA 146 147 148*  K 4.0 4.2 4.3  CL 103 102 103  CO2 37* 33* 32  GLUCOSE 98 77 92  BUN 14 26* 32*  CREATININE 0.83 0.72 0.63  CALCIUM 10.1 10.1 9.8   Liver Function Tests:  Recent Labs Lab 04/27/13 0447  AST 29  ALT 10  ALKPHOS 68  BILITOT 0.2*  PROT 6.7  ALBUMIN 3.6   No results found for this basename: LIPASE, AMYLASE,  in the last 168 hours No results found for this basename: AMMONIA,  in the last 168 hours CBC:  Recent Labs Lab 04/23/13 2139 04/26/13 1503 04/27/13 0447  WBC 6.4 8.9 3.7*  NEUTROABS 3.8 6.1  --   HGB 13.0 13.5 12.1  HCT 42.2 42.4 39.3  MCV 88.3 88.3 88.3  PLT 309 321 270   Cardiac  Enzymes:  Recent Labs Lab 04/23/13 2139 04/26/13 1503  TROPONINI <0.30 <0.30   BNP (last 3 results)  Recent Labs  02/28/13 1335 04/26/13 1503  PROBNP 76.1 95.5   CBG: No results found for this basename: GLUCAP,  in the last 168 hours  No results found for this or any previous visit (from the past 240 hour(s)).   Studies: Dg Chest Portable 1 View  04/26/2013   CLINICAL DATA:  Shortness of breath for 3-4 days.  Former smoker.  EXAM: PORTABLE CHEST - 1 VIEW  COMPARISON:  DG CHEST 1V PORT dated 04/23/2013  FINDINGS: Severe COPD. No active infiltrates or failure. Normal cardiomediastinal silhouette. Osseous demineralization. Chronic bilateral CP angle blunting.  IMPRESSION: Severe COPD, no active disease.   Electronically Signed   By: Davonna Belling M.D.   On: 04/26/2013 15:10    Scheduled Meds: . cholecalciferol  1,000 Units Oral Daily  . heparin  5,000 Units Subcutaneous 3 times per day  . ipratropium  0.5 mg Nebulization QID  . ketorolac  1 drop Both Eyes QID  . levalbuterol  0.63 mg Nebulization QID  . levofloxacin  500 mg Oral Daily  . [START ON 04/28/2013] methimazole  2.5 mg Oral QODAY  . methylPREDNISolone (SOLU-MEDROL) injection  60 mg Intravenous Q6H  . pantoprazole  40 mg Oral Daily   Continuous Infusions:  Antibiotics  Given (last 72 hours)   Date/Time Action Medication Dose   04/26/13 1827 Given   levofloxacin (LEVAQUIN) tablet 500 mg 500 mg   04/27/13 0847 Given   levofloxacin (LEVAQUIN) tablet 500 mg 500 mg      Active Problems:   COPD exacerbation   Tachycardia   Thyroid disorder   COPD (chronic obstructive pulmonary disease)    Time spent: 45min    [facility] CenterJOSEPH,Seba Madole  [facility] Pager [phone]. If 7PM-7AM, please contact night-coverage at [email or web address], password [email or web address] 04/27/2013, 2:23 PM  LOS: 1 day

## 2013-04-27 NOTE — Progress Notes (Signed)
UR chart review completed.  

## 2013-04-28 DIAGNOSIS — E876 Hypokalemia: Secondary | ICD-10-CM

## 2013-04-28 MED ORDER — METHYLPREDNISOLONE SODIUM SUCC 40 MG IJ SOLR
40.0000 mg | Freq: Four times a day (QID) | INTRAMUSCULAR | Status: AC
  Administered 2013-04-28 (×3): 40 mg via INTRAVENOUS
  Filled 2013-04-28 (×3): qty 1

## 2013-04-28 MED ORDER — DEXAMETHASONE 4 MG PO TABS
6.0000 mg | ORAL_TABLET | Freq: Every day | ORAL | Status: DC
  Administered 2013-04-29 – 2013-04-30 (×2): 6 mg via ORAL
  Filled 2013-04-28 (×3): qty 1.5

## 2013-04-28 NOTE — Evaluation (Signed)
Occupational Therapy Evaluation Patient Details Name: [name] MRN: [id number] DOB: 05/14/1935 Today's Date: 04/28/2013    History of Present Illness Pt is admitted with exacerbation of COPD.  She is O2 dependent on 2 L O2 at home and lives alone with outside assist of niece.   Clinical Impression   Pt is 78 yo presenting to acute OT with above situation. She is currently presenting near baseline with her ADL needs and is comfortable with the current level at which she is engaging (taking sponge baths, getting assist from her niece for IADL needs). Reminded pt of need to take breaks during activity due to her COPD and pt agreed. Encouraged pt to request HHOT if further needs shoulder arise after d/c. Pt does not currently require further OT services - pt to be d/c-ed.    Follow Up Recommendations  No OT follow up    Equipment Recommendations  None recommended by OT    Recommendations for Other Services       Precautions / Restrictions Precautions Precautions: Fall Restrictions Weight Bearing Restrictions: No      Mobility Bed Mobility Overal bed mobility: Modified Independent                                 Balance                            ADL                     General ADL Comments: Pt has been able to achieve most ADL needs indepdently, and neics has been able to assist with IADL needs. Pt states she has no concerns about further difficulties. Encouraged to request HHOT (through HHPT) if further needs shoulder arise.     Vision                     Perception     Praxis      Pertinent Vitals/Pain Pt denied pain     Hand Dominance Right   Extremity/Trunk Assessment Upper Extremity Assessment Upper Extremity Assessment: Overall WFL for tasks assessed (Minor weakness in BUE)   Lower Extremity Assessment Lower Extremity Assessment: Defer to PT evaluation       Communication Communication Communication: No  difficulties   Cognition Arousal/Alertness: Awake/alert Behavior During Therapy: WFL for tasks assessed/performed Overall Cognitive Status: Within Functional Limits for tasks assessed                     General Comments       Exercises      Home Living Family/patient expects to be discharged to:: Private residence Living Arrangements: Alone Available Help at Discharge: Family;Available PRN/intermittently Type of Home: House Home Access: Stairs to enter Entergy CorporationEntrance Stairs-Number of Steps: 1 Entrance Stairs-Rails: Right;Left Home Layout: Able to live on main level with bedroom/bathroom     Bathroom Shower/Tub: Tub/shower unit (pt takes a sponge bath)   Bathroom Toilet: Standard     Home Equipment: Walker - 2 wheels;Wheelchair - manual;Cane - single point (Pt uses walker)          Prior Functioning/Environment Level of Independence: Independent with assistive device(s)        Comments: Pt is independent with ADLs and some IADLs - her neice assist with IADLs - laundry, shopping, getting the mail - as needed  OT Diagnosis:     OT Problem List:     OT Treatment/Interventions:      OT Goals(Current goals can be found in the care plan section) Acute Rehab OT Goals Patient Stated Goal: Go home - no OT goals needed OT Goal Formulation: With patient Time For Goal Achievement: 05/12/13  OT Frequency:     Barriers to D/C:            End of Session:    Activity Tolerance: Patient tolerated treatment well Patient left: in bed;with call bell/phone within reach;with bed alarm set   Time: 1450-1507 OT Time Calculation (min): 17 min Charges:  OT General Charges $OT Visit: 1 Procedure OT Evaluation $Initial OT Evaluation Tier I: 1 Procedure G-Codes:      Marry Guan, MS, OTR/L [phone]  04/28/2013, 4:53 PM

## 2013-04-28 NOTE — Progress Notes (Signed)
[facility] PROGRESS NOTE  [name]:[id number] DOB: 13-Sep-1935 DOA: 04/26/2013 PCP: [name], MD  Assessment/Plan: 1. COPD Exacerbation -improving -continue levaquin, cut down dose of IV solumedrol, transition to Po decadron tomorrow, intolerant to Prednisone -albuterol, atrovent nebs -ambulate as tolerated -uses 2L Home O2 for COPD/Chronic resp failure  2. Tachycardia -due to B agonists/nebs -improved -TSH/t4 normal  3. Severe malnutrition -RD consult  4. Hyperthyroidism -continue methimazole, TFTs normal  Ambulate, PT/OT DC tele  Code Status: Full Code Family Communication: none at bedside Disposition Plan: home in 1-2days  HPI/Subjective: Breathing better, swallowing improving too  Objective: Filed Vitals:   04/28/13 0459  BP: 99/50  Pulse: 73  Temp: 97.4 F (36.3 C)  Resp: 15    Intake/Output Summary (Last 24 hours) at 04/28/13 1354 Last data filed at 04/27/13 1700  Gross per 24 hour  Intake    200 ml  Output      0 ml  Net    200 ml   Filed Weights   04/26/13 1436 04/26/13 1826  Weight: 39.009 kg (86 lb) 35.97 kg (79 lb 4.8 oz)    Exam:   General:  AAOx3, frail, emaciated female  Cardiovascular: S1S2/RRR  Respiratory: improved air movement, no wheezing  Abdomen: soft, NT, BS present  Musculoskeletal: no edema c/c  Data Reviewed: Basic Metabolic Panel:  Recent Labs Lab 04/23/13 2139 04/26/13 1503 04/27/13 0447  NA 146 147 148*  K 4.0 4.2 4.3  CL 103 102 103  CO2 37* 33* 32  GLUCOSE 98 77 92  BUN 14 26* 32*  CREATININE 0.83 0.72 0.63  CALCIUM 10.1 10.1 9.8   Liver Function Tests:  Recent Labs Lab 04/27/13 0447  AST 29  ALT 10  ALKPHOS 68  BILITOT 0.2*  PROT 6.7  ALBUMIN 3.6   No results found for this basename: LIPASE, AMYLASE,  in the last 168 hours No results found for this basename: AMMONIA,  in the last 168 hours CBC:  Recent Labs Lab 04/23/13 2139 04/26/13 1503 04/27/13 0447  WBC 6.4 8.9  3.7*  NEUTROABS 3.8 6.1  --   HGB 13.0 13.5 12.1  HCT 42.2 42.4 39.3  MCV 88.3 88.3 88.3  PLT 309 321 270   Cardiac Enzymes:  Recent Labs Lab 04/23/13 2139 04/26/13 1503  TROPONINI <0.30 <0.30   BNP (last 3 results)  Recent Labs  02/28/13 1335 04/26/13 1503  PROBNP 76.1 95.5   CBG: No results found for this basename: GLUCAP,  in the last 168 hours  No results found for this or any previous visit (from the past 240 hour(s)).   Studies: Dg Chest Portable 1 View  04/26/2013   CLINICAL DATA:  Shortness of breath for 3-4 days.  Former smoker.  EXAM: PORTABLE CHEST - 1 VIEW  COMPARISON:  DG CHEST 1V PORT dated 04/23/2013  FINDINGS: Severe COPD. No active infiltrates or failure. Normal cardiomediastinal silhouette. Osseous demineralization. Chronic bilateral CP angle blunting.  IMPRESSION: Severe COPD, no active disease.   Electronically Signed   By: Davonna Belling M.D.   On: 04/26/2013 15:10    Scheduled Meds: . cholecalciferol  1,000 Units Oral Daily  . [START ON 04/29/2013] dexamethasone  6 mg Oral Daily  . feeding supplement (PRO-STAT SUGAR FREE 64)  30 mL Oral TID WC  . heparin  5,000 Units Subcutaneous 3 times per day  . ipratropium  0.5 mg Nebulization Q6H  . ketorolac  1 drop Both Eyes QID  . levalbuterol  0.63 mg Nebulization Q6H  . levofloxacin  500 mg Oral Daily  . methimazole  2.5 mg Oral QODAY  . methylPREDNISolone (SOLU-MEDROL) injection  40 mg Intravenous Q6H  . pantoprazole  40 mg Oral Daily   Continuous Infusions:  Antibiotics Given (last 72 hours)   Date/Time Action Medication Dose   04/26/13 1827 Given   levofloxacin (LEVAQUIN) tablet 500 mg 500 mg   04/27/13 0847 Given   levofloxacin (LEVAQUIN) tablet 500 mg 500 mg   04/28/13 1142 Given   levofloxacin (LEVAQUIN) tablet 500 mg 500 mg      Active Problems:   COPD exacerbation   Tachycardia   Thyroid disorder   Respiratory failure, acute and chronic   Protein-calorie malnutrition, severe   COPD  (chronic obstructive pulmonary disease)    Time spent: 25min    [facility] LPJOSEPH,Gladine Plude  [facility] Pager [phone]. If 7PM-7AM, please contact night-coverage at [email or web address], password [email or web address] 04/28/2013, 1:54 PM  LOS: 2 days

## 2013-04-28 NOTE — Progress Notes (Signed)
NUTRITION FOLLOW UP  Intervention:   Continue with current nutrition POC  Nutrition Dx:   Inadequate oral intake related to decreased appetite as evidenced by minimal PO's, 20% wt loss x 3 days; ongoing  Goal:   Pt will meet >90% of estimated nutritional needs; goal not met  Monitor:   PO intake, labs, skin assessments, weight changes, I/O's  Assessment:   Received MD consult to assess nutrition needs. Pt was evaluated yesterday (04/27/13) by RD. Pt was diagnosed with severe malnutrition due to poor po intake, wt loss, and severe muscle depletion. Pt diet was downgraded to Dysphagia 3. 30 ml Prostat TID was ordered by RD on 04/27/13 for additional support (supplement regimen provides 300 kcals and 45 grams protein daily). PO intake continues to be poor. No new weight to evaluate at this time.  If pt is unable to meet needs via PO route, may need to consider enteral nutrition support to assist pt in meeting nutrition goals.   Height: Ht Readings from Last 1 Encounters:  04/26/13 '5\' 1"'  (1.549 m)    Weight Status:   Wt Readings from Last 1 Encounters:  04/26/13 79 lb 4.8 oz (35.97 kg)    Re-estimated needs:  Kcal: 9179-1505 daily  Protein: 36-45 grams daily  Fluid: 1.1-1.3 L daily  Skin: Intact  Diet Order: Dysphagia   Intake/Output Summary (Last 24 hours) at 04/28/13 1423 Last data filed at 04/27/13 1700  Gross per 24 hour  Intake    200 ml  Output      0 ml  Net    200 ml    Last BM: 04/27/13   Labs:   Recent Labs Lab 04/23/13 2139 04/26/13 1503 04/27/13 0447  NA 146 147 148*  K 4.0 4.2 4.3  CL 103 102 103  CO2 37* 33* 32  BUN 14 26* 32*  CREATININE 0.83 0.72 0.63  CALCIUM 10.1 10.1 9.8  GLUCOSE 98 77 92    CBG (last 3)  No results found for this basename: GLUCAP,  in the last 72 hours  Scheduled Meds: . cholecalciferol  1,000 Units Oral Daily  . [START ON 04/29/2013] dexamethasone  6 mg Oral Daily  . feeding supplement (PRO-STAT SUGAR FREE 64)  30  mL Oral TID WC  . heparin  5,000 Units Subcutaneous 3 times per day  . ipratropium  0.5 mg Nebulization Q6H  . ketorolac  1 drop Both Eyes QID  . levalbuterol  0.63 mg Nebulization Q6H  . levofloxacin  500 mg Oral Daily  . methimazole  2.5 mg Oral QODAY  . methylPREDNISolone (SOLU-MEDROL) injection  40 mg Intravenous Q6H  . pantoprazole  40 mg Oral Daily    Continuous Infusions:   [name] A. [name], RD, LDN Pager: [phone]

## 2013-04-28 NOTE — Evaluation (Signed)
Physical Therapy Evaluation Patient Details Name: [name] MRN: [id number] DOB: January 17, 1936 Today's Date: 04/28/2013   History of Present Illness  Pt is admitted with exacerbation of COPD.  She is O2 dependent on 2 L O2 at home and lives alone with outside assist of niece.  Clinical Impression  Pt is seen for evaluation.  She is alert and oriented, c/o general malaise.  She appears emaciated with poor muscle integrity.  She is on 2 L O2 and still feels dyspneic although O2 sat =96%.  She is significantly deconditioned with generalized muscle weakness and has a severe thoracic kyphosis.  She had to be coaxed to work with me this afternoon and she was ultimately able to ambulate 30' with a walker and SBA.  In my opinion, she should not be living alone.  She would be appropriate for SNF but she declines this.  I have recommended that she see if family could stay with her for awhile at d/c.  She would benefit from HHPT.    Follow Up Recommendations Home health PT    Equipment Recommendations  None recommended by PT    Recommendations for Other Services       Precautions / Restrictions Precautions Precautions: Fall Restrictions Weight Bearing Restrictions: No      Mobility  Bed Mobility Overal bed mobility: Modified Independent                Transfers Overall transfer level: Modified independent Equipment used: Rolling walker (2 wheeled)                Ambulation/Gait Ambulation/Gait assistance: Supervision Ambulation Distance (Feet): 30 Feet Assistive device: Rolling walker (2 wheeled) Gait Pattern/deviations: Trunk flexed   Gait velocity interpretation: at or above normal speed for age/gender                   Balance Overall balance assessment: Needs assistance         Standing balance support: Bilateral upper extremity supported Standing balance-Leahy Scale: Fair                        Home Living Family/patient expects to be  discharged to:: Private residence Living Arrangements: Alone Available Help at Discharge: Family;Available PRN/intermittently Type of Home: House Home Access: Stairs to enter Entrance Stairs-Rails: Doctor, general practice of Steps: 1 Home Layout: One level Home Equipment: Walker - 2 wheels;Wheelchair - manual      Prior Function Level of Independence: Independent with assistive device(s)         Comments: ambulates with a walker....her niece takes her to the doctor, store, etc.             Extremity/Trunk Assessment               Lower Extremity Assessment: Generalized weakness         Communication   Communication: No difficulties  Cognition Arousal/Alertness: Awake/alert Behavior During Therapy: WFL for tasks assessed/performed Overall Cognitive Status: Within Functional Limits for tasks assessed                                    Assessment/Plan    PT Assessment Patient needs continued PT services  PT Diagnosis Difficulty walking;Generalized weakness   PT Problem List Decreased strength;Decreased activity tolerance;Decreased mobility;Cardiopulmonary status limiting activity  PT Treatment Interventions Gait training;Functional mobility training;Therapeutic exercise;Patient/family education   PT Goals (  Current goals can be found in the Care Plan section) Acute Rehab PT Goals Patient Stated Goal: none stated PT Goal Formulation: With patient Time For Goal Achievement: 05/12/13 Potential to Achieve Goals: Fair    Frequency Min 3X/week   Barriers to discharge Decreased caregiver support pt should have 24 hour support at this time    End of Session Equipment Utilized During Treatment: Gait belt Activity Tolerance: Patient limited by fatigue Patient left: in bed;with bed alarm set;with call bell/phone within reach         Time: 1344-1415 PT Time Calculation (min): 31 min   Charges:   PT Evaluation $Initial PT  Evaluation Tier I: 1 Procedure     PT G CodesMyrlene Broker:          Joaquina Nissen L 04/28/2013, 2:18 PM

## 2013-04-28 NOTE — Care Management Note (Signed)
    Page 1 of 2   04/28/2013     11:06:43 AM   CARE MANAGEMENT NOTE 04/28/2013  Patient:  [name],[name]   Account Number:  [id number]  Date Initiated:  04/28/2013  Documentation initiated by:  Sharrie RothmanBLACKWELL,Chrsitopher Wik C  Subjective/Objective Assessment:   Pt admitted from home with COPD. Pt lives alone and has a niece that is very active in the care of the pt. Pt also has a neighbor that helps out when needed. Pt has home O2 with [employer], a neb machine, cane, walker, and w/c. Pt is fairly     Action/Plan:   independent with ADL's. Pt [facility] HH RN and [facility] No DME needs noted.   Anticipated DC Date:  05/01/2013   Anticipated DC Plan:  HOME W HOME HEALTH SERVICES      DC Planning Services  CM consult      [facility]   Choice offered to / List presented to:  C-1 Patient        HH arranged  HH-1 RN  HH-2 PT  HH-10 DISEASE MANAGEMENT      HH agency  [facility]   Status of service:  Completed, signed off Medicare Important Message given?  YES (If response is "NO", the following Medicare IM given date fields will be blank) Date Medicare IM given:  04/28/2013 Date Additional Medicare IM given:    Discharge Disposition:  HOME W HOME HEALTH SERVICES  Per UR Regulation:    If discussed at Long Length of Stay Meetings, dates discussed:    Comments:  04/28/13 1105 Arlyss Queenammy Vernida Mcnicholas, RN BSN CM

## 2013-04-29 DIAGNOSIS — E43 Unspecified severe protein-calorie malnutrition: Secondary | ICD-10-CM

## 2013-04-29 DIAGNOSIS — E079 Disorder of thyroid, unspecified: Secondary | ICD-10-CM

## 2013-04-29 DIAGNOSIS — R000 Tachycardia, unspecified: Secondary | ICD-10-CM

## 2013-04-29 DIAGNOSIS — J441 Chronic obstructive pulmonary disease with (acute) exacerbation: Secondary | ICD-10-CM

## 2013-04-29 MED ORDER — IPRATROPIUM BROMIDE 0.02 % IN SOLN
0.5000 mg | Freq: Three times a day (TID) | RESPIRATORY_TRACT | Status: DC
  Administered 2013-04-29 – 2013-04-30 (×3): 0.5 mg via RESPIRATORY_TRACT
  Filled 2013-04-29 (×3): qty 2.5

## 2013-04-29 MED ORDER — LEVALBUTEROL HCL 0.63 MG/3ML IN NEBU
0.6300 mg | INHALATION_SOLUTION | Freq: Three times a day (TID) | RESPIRATORY_TRACT | Status: DC
  Administered 2013-04-29 – 2013-04-30 (×3): 0.63 mg via RESPIRATORY_TRACT
  Filled 2013-04-29 (×3): qty 3

## 2013-04-29 NOTE — Progress Notes (Signed)
[facility] PROGRESS NOTE  [name] ZOX:[id number] DOB: [birth year] DOA: 04/26/2013 PCP: [name], MD  Assessment/Plan: 1. COPD Exacerbation -improving slowly -continue levaquin,  -stopped IV solumedrol, transition to Po decadron today, intolerant to Prednisone -albuterol, atrovent nebs -ambulate as tolerated -uses 2L Home O2 for COPD/Chronic resp failure  2. Tachycardia -due to B agonists/nebs -improved -TSH/t4 normal  3. Severe malnutrition -RD consult appreciated  4. Hyperthyroidism -continue methimazole, TFTs normal  Ambulate, PT/OT Home tomorrow  Code Status: Full Code Family Communication: none at bedside Disposition Plan: home in 1-2days  HPI/Subjective: Breathing better, swallowing improving too Anxious to go home on decadron  Objective: Filed Vitals:   04/29/13 0353  BP: 106/66  Pulse: 83  Temp: 98 F (36.7 C)  Resp: 18    Intake/Output Summary (Last 24 hours) at 04/29/13 1045 Last data filed at 04/29/13 0900  Gross per 24 hour  Intake   1160 ml  Output    450 ml  Net    710 ml   Filed Weights   04/26/13 1436 04/26/13 1826 04/29/13 0353  Weight: 39.009 kg (86 lb) 35.97 kg (79 lb 4.8 oz) 37.467 kg (82 lb 9.6 oz)    Exam:   General:  AAOx3, frail, emaciated female  Cardiovascular: S1S2/RRR  Respiratory: improved air movement, no wheezing  Abdomen: soft, NT, BS present  Musculoskeletal: no edema c/c  Data Reviewed: Basic Metabolic Panel:  Recent Labs Lab 04/23/13 2139 04/26/13 1503 04/27/13 0447  NA 146 147 148*  K 4.0 4.2 4.3  CL 103 102 103  CO2 37* 33* 32  GLUCOSE 98 77 92  BUN 14 26* 32*  CREATININE 0.83 0.72 0.63  CALCIUM 10.1 10.1 9.8   Liver Function Tests:  Recent Labs Lab 04/27/13 0447  AST 29  ALT 10  ALKPHOS 68  BILITOT 0.2*  PROT 6.7  ALBUMIN 3.6   No results found for this basename: LIPASE, AMYLASE,  in the last 168 hours No results found for this basename: AMMONIA,  in the last 168  hours CBC:  Recent Labs Lab 04/23/13 2139 04/26/13 1503 04/27/13 0447  WBC 6.4 8.9 3.7*  NEUTROABS 3.8 6.1  --   HGB 13.0 13.5 12.1  HCT 42.2 42.4 39.3  MCV 88.3 88.3 88.3  PLT 309 321 270   Cardiac Enzymes:  Recent Labs Lab 04/23/13 2139 04/26/13 1503  TROPONINI <0.30 <0.30   BNP (last 3 results)  Recent Labs  02/28/13 1335 04/26/13 1503  PROBNP 76.1 95.5   CBG: No results found for this basename: GLUCAP,  in the last 168 hours  No results found for this or any previous visit (from the past 240 hour(s)).   Studies: No results found.  Scheduled Meds: . cholecalciferol  1,000 Units Oral Daily  . dexamethasone  6 mg Oral Daily  . feeding supplement (PRO-STAT SUGAR FREE 64)  30 mL Oral TID WC  . heparin  5,000 Units Subcutaneous 3 times per day  . ipratropium  0.5 mg Nebulization Q6H  . ketorolac  1 drop Both Eyes QID  . levalbuterol  0.63 mg Nebulization Q6H  . levofloxacin  500 mg Oral Daily  . methimazole  2.5 mg Oral QODAY  . pantoprazole  40 mg Oral Daily   Continuous Infusions:  Antibiotics Given (last 72 hours)   Date/Time Action Medication Dose   04/26/13 1827 Given   levofloxacin (LEVAQUIN) tablet 500 mg 500 mg   04/27/13 0847 Given   levofloxacin (LEVAQUIN) tablet 500 mg  500 mg   04/28/13 1142 Given   levofloxacin (LEVAQUIN) tablet 500 mg 500 mg   04/29/13 1026 Given   levofloxacin (LEVAQUIN) tablet 500 mg 500 mg      Active Problems:   COPD exacerbation   Tachycardia   Thyroid disorder   Respiratory failure, acute and chronic   Protein-calorie malnutrition, severe   COPD (chronic obstructive pulmonary disease)    Time spent: 25min    [facility] LLCJOSEPH,Elohim Brune  [facility] Pager [phone]. If 7PM-7AM, please contact night-coverage at [email or web address], password [email or web address] 04/29/2013, 10:45 AM  LOS: 3 days

## 2013-04-30 DIAGNOSIS — J9620 Acute and chronic respiratory failure, unspecified whether with hypoxia or hypercapnia: Secondary | ICD-10-CM

## 2013-04-30 MED ORDER — LEVOFLOXACIN 500 MG PO TABS: 500.0000 mg | ORAL_TABLET | Freq: Every day | ORAL | Status: DC

## 2013-04-30 MED ORDER — DEXAMETHASONE 2 MG PO TABS: 2.0000 mg | ORAL_TABLET | Freq: Every day | ORAL | Status: DC

## 2013-04-30 MED ORDER — ALBUTEROL SULFATE HFA 108 (90 BASE) MCG/ACT IN AERS: 2.0000 | INHALATION_SPRAY | RESPIRATORY_TRACT | Status: AC | PRN

## 2013-04-30 MED ORDER — TIOTROPIUM BROMIDE MONOHYDRATE 18 MCG IN CAPS: 18.0000 ug | ORAL_CAPSULE | Freq: Every morning | RESPIRATORY_TRACT | Status: DC

## 2013-04-30 NOTE — Discharge Summary (Addendum)
Physician Discharge Summary  [name]:[id number] DOB: [birth year] DOA: 04/26/2013  PCP: Emily Pizza, MD  Admit date: 04/26/2013 Discharge date: 04/30/2013  Time spent: 45 minutes  Recommendations for Outpatient Follow-up:  1. PCP in 1 week 2. Unclear why Pt is not on Spiriva, she is afraid that she's intolerant to this or to inhaled steroids, Please consider inhaled steroids too if not strong contraindication too  Discharge Diagnoses:  Active Problems:   COPD exacerbation   Tachycardia   Thyroid disorder   Respiratory failure, acute and chronic   Protein-calorie malnutrition, severe   COPD (chronic obstructive pulmonary disease)   Discharge Condition: stable  Diet recommendation: regular  Filed Weights   04/26/13 1826 04/29/13 0353 04/30/13 0603  Weight: 35.97 kg (79 lb 4.8 oz) 37.467 kg (82 lb 9.6 oz) 37.195 kg (82 lb)    History of present illness:  This is a 78 year old lady who is known COPD and presents with approximately one week history of dyspnea associated with a cough productive of white sputum. She was in the emergency room a few days ago and was sent home with oral steroids and antibiotics. She did well for the next one to 2 days but then she is now gotten worse again. She denies any fever. When she came to the emergency room, she was in respiratory distress with hypoxemia requiring increased oxygen. She is now being admitted for further treatment.   Hospital Course:  1. COPD Exacerbation -improved, back to baseline -continue levaquin, CXR with no pneumonia, but severe COPS -stopped IV solumedrol, transition to Po decadron 3/28, intolerant to Prednisone  -albuterol, atrovent nebs  -uses 2L Home O2 for COPD/Chronic resp failure  -with get HH PT and RN for chronic disease management  2. Tachycardia  -due to B agonists/nebs  -improved  -TSH/t4 normal   3. Severe malnutrition  -RD consulted  4. Hyperthyroidism  -continue methimazole, TFTs normal     Discharge Exam: Filed Vitals:   04/30/13 0603  BP: 134/85  Pulse: 67  Temp: 97.8 F (36.6 C)  Resp: 16    General: AAOx3, frail Cardiovascular: S1S2/RRR Respiratory: improved air movement  Discharge Instructions  Discharge Orders   Future Orders Complete By Expires   Diet general  As directed    Increase activity slowly  As directed        Medication List         albuterol (2.5 MG/3ML) 0.083% nebulizer solution  Commonly known as:  PROVENTIL  Take 2.5 mg by nebulization every 6 (six) hours as needed for wheezing or shortness of breath.     albuterol 108 (90 BASE) MCG/ACT inhaler  Commonly known as:  PROVENTIL HFA;VENTOLIN HFA  Inhale 2 puffs into the lungs every 4 (four) hours as needed for wheezing or shortness of breath.     cholecalciferol 1000 UNITS tablet  Commonly known as:  VITAMIN D  Take 1,000 Units by mouth daily.     dexamethasone 2 MG tablet  Commonly known as:  DECADRON  Take 1-2 tablets (2-4 mg total) by mouth daily. Take 4mg  for 2days then 2mg  for 2days then STOP     GAS RELIEF PO  Take 1-2 tablets by mouth daily as needed (gas relief).     ketorolac 0.4 % Soln  Commonly known as:  ACULAR  Place 1 drop into both eyes 4 (four) times daily.     levofloxacin 500 MG tablet  Commonly known as:  LEVAQUIN  Take 1 tablet (500 mg  total) by mouth daily. For 3 more days, use left of from your recent prescription     methimazole 5 MG tablet  Commonly known as:  TAPAZOLE  Take 2.5 mg by mouth every other day.     omeprazole 20 MG capsule  Commonly known as:  PRILOSEC  Take 1 capsule by mouth daily.     OXYGEN-HELIUM IN  Inhale 2 L into the lungs continuous.       Allergies  Allergen Reactions  . Promist La [Pseudoephedrine-Guaifenesin] Shortness Of Breath  . Sulfa Antibiotics Nausea And Vomiting  . Aspirin Other (See Comments)    "FELT AS IF SHE WOULD DIE"  . Iodine Other (See Comments)    FELT "BAD" EXAUSTED   . Latex Itching     PLASTIC BANDAIDS  . Prednisone Palpitations    SPEEDS HEART RATE       Follow-up Information   Follow up with [facility].   Contact information:   [address] [place] [address] [phone]       Follow up with Emily PizzaHALL, ZACH, MD. Schedule an appointment as soon as possible for a visit in 1 week.   Specialty:  Internal Medicine   Contact information:    [address]  [place] [address] [phone]        The results of significant diagnostics from this hospitalization (including imaging, microbiology, ancillary and laboratory) are listed below for reference.    Significant Diagnostic Studies: Dg Chest Portable 1 View  04/26/2013   CLINICAL DATA:  Shortness of breath for 3-4 days.  Former smoker.  EXAM: PORTABLE CHEST - 1 VIEW  COMPARISON:  DG CHEST 1V PORT dated 04/23/2013  FINDINGS: Severe COPD. No active infiltrates or failure. Normal cardiomediastinal silhouette. Osseous demineralization. Chronic bilateral CP angle blunting.  IMPRESSION: Severe COPD, no active disease.   Electronically Signed   By: Davonna BellingJohn  Curnes M.D.   On: 04/26/2013 15:10   Dg Chest Portable 1 View  04/23/2013   CLINICAL DATA:  COPD.  Shortness of breath.  EXAM: PORTABLE CHEST - 1 VIEW  COMPARISON:  Chest x-ray 02/28/2013.  FINDINGS: Lungs are hyperexpanded with flattening of the hemidiaphragms and pruning of the pulmonary vasculature in the periphery, compatible with advanced emphysema. No acute consolidative airspace disease. No pleural effusions. No evidence of pulmonary edema. Heart size is normal. Mediastinal contours are unremarkable. Atherosclerosis in the thoracic aorta.  IMPRESSION: 1. Advanced emphysematous changes redemonstrated, as above, without radiographic evidence of acute cardiopulmonary disease.   Electronically Signed   By: Trudie Reedaniel  Entrikin M.D.   On: 04/23/2013 21:54    Microbiology: No results found for this or any previous visit (from the past 240 hour(s)).    Labs: Basic Metabolic Panel:  Recent Labs Lab 04/23/13 2139 04/26/13 1503 04/27/13 0447  NA 146 147 148*  K 4.0 4.2 4.3  CL 103 102 103  CO2 37* 33* 32  GLUCOSE 98 77 92  BUN 14 26* 32*  CREATININE 0.83 0.72 0.63  CALCIUM 10.1 10.1 9.8   Liver Function Tests:  Recent Labs Lab 04/27/13 0447  AST 29  ALT 10  ALKPHOS 68  BILITOT 0.2*  PROT 6.7  ALBUMIN 3.6   No results found for this basename: LIPASE, AMYLASE,  in the last 168 hours No results found for this basename: AMMONIA,  in the last 168 hours CBC:  Recent Labs Lab 04/23/13 2139 04/26/13 1503 04/27/13 0447  WBC 6.4 8.9 3.7*  NEUTROABS 3.8  6.1  --   HGB 13.0 13.5 12.1  HCT 42.2 42.4 39.3  MCV 88.3 88.3 88.3  PLT 309 321 270   Cardiac Enzymes:  Recent Labs Lab 04/23/13 2139 04/26/13 1503  TROPONINI <0.30 <0.30   BNP: BNP (last 3 results)  Recent Labs  02/28/13 1335 04/26/13 1503  PROBNP 76.1 95.5   CBG: No results found for this basename: GLUCAP,  in the last 168 hours     Signed:  Kaysee Hergert  [facility] 04/30/2013, 11:14 AM

## 2013-04-30 NOTE — Progress Notes (Signed)
Pt taken to family members car via wheelchair by staff member.  Portable O2 tank from home with patient.

## 2013-04-30 NOTE — Progress Notes (Signed)
IV removed, site WNL.  Pt given d/c instructions and new prescriptions.  Discussed all home medications (when, how, and why to take), patient verbalizes understanding. Discussed home care with patient, teachback completed. F/U appointment will be made by patient tomorrow when offices open, pt states she will make and keep appointment. Pt is stable at this time, patients son aware of discharge.  HH has been arranged.

## 2013-05-01 NOTE — Progress Notes (Signed)
UR chart review completed.  

## 2013-05-08 ENCOUNTER — Emergency Department (HOSPITAL_COMMUNITY)
Admission: EM | Admit: 2013-05-08 | Discharge: 2013-05-08 | Disposition: A | Discharge status: Home / Self Care | Payer: Medicare Other | Source: Home / Self Care | Attending: Emergency Medicine | Admitting: Emergency Medicine

## 2013-05-08 ENCOUNTER — Emergency Department (HOSPITAL_COMMUNITY): Payer: Medicare Other

## 2013-05-08 ENCOUNTER — Encounter (HOSPITAL_COMMUNITY): Payer: Self-pay | Admitting: Emergency Medicine

## 2013-05-08 ENCOUNTER — Other Ambulatory Visit: Payer: Self-pay

## 2013-05-08 DIAGNOSIS — J159 Unspecified bacterial pneumonia: Secondary | ICD-10-CM

## 2013-05-08 DIAGNOSIS — Z79899 Other long term (current) drug therapy: Secondary | ICD-10-CM

## 2013-05-08 DIAGNOSIS — E079 Disorder of thyroid, unspecified: Secondary | ICD-10-CM

## 2013-05-08 DIAGNOSIS — Z87891 Personal history of nicotine dependence: Secondary | ICD-10-CM | POA: Insufficient documentation

## 2013-05-08 DIAGNOSIS — Z8739 Personal history of other diseases of the musculoskeletal system and connective tissue: Secondary | ICD-10-CM

## 2013-05-08 DIAGNOSIS — Z792 Long term (current) use of antibiotics: Secondary | ICD-10-CM

## 2013-05-08 DIAGNOSIS — IMO0002 Reserved for concepts with insufficient information to code with codable children: Secondary | ICD-10-CM | POA: Insufficient documentation

## 2013-05-08 DIAGNOSIS — J441 Chronic obstructive pulmonary disease with (acute) exacerbation: Secondary | ICD-10-CM

## 2013-05-08 DIAGNOSIS — J45901 Unspecified asthma with (acute) exacerbation: Secondary | ICD-10-CM

## 2013-05-08 DIAGNOSIS — Z91040 Latex allergy status: Secondary | ICD-10-CM

## 2013-05-08 DIAGNOSIS — J189 Pneumonia, unspecified organism: Secondary | ICD-10-CM

## 2013-05-08 DIAGNOSIS — J449 Chronic obstructive pulmonary disease, unspecified: Secondary | ICD-10-CM

## 2013-05-08 LAB — BASIC METABOLIC PANEL
BUN: 14 mg/dL (ref 6–23)
CHLORIDE: 104 meq/L (ref 96–112)
CO2: 36 mEq/L — ABNORMAL HIGH (ref 19–32)
Calcium: 9.5 mg/dL (ref 8.4–10.5)
Creatinine, Ser: 0.76 mg/dL (ref 0.50–1.10)
GFR calc Af Amer: >90 mL/min (ref >90)
GFR, EST NON AFRICAN AMERICAN: 80 mL/min — AB (ref >90)
Glucose, Bld: 124 mg/dL — ABNORMAL HIGH (ref 70–99)
Potassium: 4 mEq/L (ref 3.7–5.3)
SODIUM: 146 meq/L (ref 137–147)

## 2013-05-08 LAB — CBC WITH DIFFERENTIAL/PLATELET
Basophils Absolute: 0 10*3/uL (ref 0.0–0.1)
Basophils Relative: 0 % (ref 0–1)
Eosinophils Absolute: 0.4 10*3/uL (ref 0.0–0.7)
Eosinophils Relative: 4 % (ref 0–5)
HCT: 40 % (ref 36.0–46.0)
HEMOGLOBIN: 12.5 g/dL (ref 12.0–15.0)
Lymphocytes Relative: 14 % (ref 12–46)
Lymphs Abs: 1.6 10*3/uL (ref 0.7–4.0)
MCH: 27.8 pg (ref 26.0–34.0)
MCHC: 31.3 g/dL (ref 30.0–36.0)
MCV: 88.9 fL (ref 78.0–100.0)
MONOS PCT: 6 % (ref 3–12)
Monocytes Absolute: 0.6 10*3/uL (ref 0.1–1.0)
NEUTROS ABS: 8.4 10*3/uL — AB (ref 1.7–7.7)
NEUTROS PCT: 76 % (ref 43–77)
PLATELETS: 314 10*3/uL (ref 150–400)
RBC: 4.5 MIL/uL (ref 3.87–5.11)
RDW: 14.7 % (ref 11.5–15.5)
WBC: 11.1 10*3/uL — ABNORMAL HIGH (ref 4.0–10.5)

## 2013-05-08 MED ORDER — ALBUTEROL SULFATE (2.5 MG/3ML) 0.083% IN NEBU
2.5000 mg | INHALATION_SOLUTION | Freq: Once | RESPIRATORY_TRACT | Status: AC
  Administered 2013-05-08: 2.5 mg via RESPIRATORY_TRACT
  Filled 2013-05-08: qty 3

## 2013-05-08 MED ORDER — IPRATROPIUM-ALBUTEROL 0.5-2.5 (3) MG/3ML IN SOLN
3.0000 mL | Freq: Once | RESPIRATORY_TRACT | Status: AC
  Administered 2013-05-08: 3 mL via RESPIRATORY_TRACT
  Filled 2013-05-08: qty 3

## 2013-05-08 MED ORDER — CEFUROXIME AXETIL 500 MG PO TABS: 500.0000 mg | ORAL_TABLET | Freq: Two times a day (BID) | ORAL | Status: DC

## 2013-05-08 MED ORDER — VANCOMYCIN HCL IN DEXTROSE 1-5 GM/200ML-% IV SOLN: 1000.0000 mg | Freq: Once | INTRAVENOUS | Status: DC

## 2013-05-08 MED ORDER — DEXAMETHASONE SODIUM PHOSPHATE 10 MG/ML IJ SOLN
6.0000 mg | Freq: Once | INTRAMUSCULAR | Status: AC
  Administered 2013-05-08: 6 mg via INTRAVENOUS
  Filled 2013-05-08: qty 1

## 2013-05-08 MED ORDER — PIPERACILLIN-TAZOBACTAM 3.375 G IVPB 30 MIN
3.3750 g | Freq: Once | INTRAVENOUS | Status: AC
  Administered 2013-05-08: 3.375 g via INTRAVENOUS
  Filled 2013-05-08 (×2): qty 50

## 2013-05-08 MED ORDER — PIPERACILLIN-TAZOBACTAM 3.375 G IVPB: 3.3750 g | Freq: Once | INTRAVENOUS | Status: DC

## 2013-05-08 MED ORDER — LEVOFLOXACIN 500 MG PO TABS: 500.0000 mg | ORAL_TABLET | Freq: Every day | ORAL | Status: DC

## 2013-05-08 MED ORDER — METHYLPREDNISOLONE SODIUM SUCC 125 MG IJ SOLR
125.0000 mg | Freq: Once | INTRAMUSCULAR | Status: DC
  Filled 2013-05-08: qty 2

## 2013-05-08 MED ORDER — DEXAMETHASONE 2 MG PO TABS: ORAL_TABLET | ORAL | Status: DC

## 2013-05-08 NOTE — ED Notes (Signed)
Per ems, pt called out d/t sob.  Pt reports taking a breathing treatment at home prior to ems arrival and feels " a little better".  Per ems, pt has chronic copd, asthma, and bronchitis.  Per ems, pt was 100% O2 upon their arrival.

## 2013-05-08 NOTE — Discharge Instructions (Signed)
Follow up tomorrow with dr. Juanetta GoslingHawkins as planned

## 2013-05-08 NOTE — ED Provider Notes (Signed)
CSN: [id number]     Arrival date & time 05/08/13  1605 History   First MD Initiated Contact with Patient 05/08/13 1615     Chief Complaint  Patient presents with  . Shortness of Breath     (Consider location/radiation/quality/duration/timing/severity/associated sxs/prior Treatment) Patient is a 78 y.o. female presenting with shortness of breath. The history is provided by the patient (pt complains of sob and cough).  Shortness of Breath Severity:  Moderate Onset quality:  Sudden Timing:  Intermittent Chronicity:  New Context: activity   Relieved by:  Inhaler Associated symptoms: wheezing   Associated symptoms: no abdominal pain, no chest pain, no cough, no headaches and no rash     Past Medical History  Diagnosis Date  . Arthritis   . Asthma   . COPD (chronic obstructive pulmonary disease)   . Thyroid disorder 10/31/2010   Past Surgical History  Procedure Laterality Date  . Abdominal hysterectomy     No family history on file. History  Substance Use Topics  . Smoking status: Former Games developermoker  . Smokeless tobacco: Not on file  . Alcohol Use: No   OB History   Grav Para Term Preterm Abortions TAB SAB Ect Mult Living                 Review of Systems  Constitutional: Negative for appetite change and fatigue.  HENT: Negative for congestion, ear discharge and sinus pressure.   Eyes: Negative for discharge.  Respiratory: Positive for shortness of breath and wheezing. Negative for cough.   Cardiovascular: Negative for chest pain.  Gastrointestinal: Negative for abdominal pain and diarrhea.  Genitourinary: Negative for frequency and hematuria.  Musculoskeletal: Negative for back pain.  Skin: Negative for rash.  Neurological: Negative for seizures and headaches.  Psychiatric/Behavioral: Negative for hallucinations.      Allergies  Promist la; Sulfa antibiotics; Aspirin; Iodine; Latex; and Prednisone  Home Medications   Current Outpatient Rx  Name  Route  Sig   Dispense  Refill  . albuterol (PROVENTIL HFA;VENTOLIN HFA) 108 (90 BASE) MCG/ACT inhaler   Inhalation   Inhale 2 puffs into the lungs every 4 (four) hours as needed for wheezing or shortness of breath.   1 Inhaler   0   . albuterol (PROVENTIL) (2.5 MG/3ML) 0.083% nebulizer solution   Nebulization   Take 2.5 mg by nebulization every 6 (six) hours as needed for wheezing or shortness of breath.         . cholecalciferol (VITAMIN D) 1000 UNITS tablet   Oral   Take 1,000 Units by mouth daily.           Marland Kitchen. ketorolac (ACULAR) 0.4 % SOLN   Both Eyes   Place 1 drop into both eyes 4 (four) times daily.         . methimazole (TAPAZOLE) 5 MG tablet   Oral   Take 2.5 mg by mouth every other day.          Marland Kitchen. omeprazole (PRILOSEC) 20 MG capsule   Oral   Take 1 capsule by mouth daily.         . OXYGEN-HELIUM IN   Inhalation   Inhale 2 L into the lungs continuous.         . Simethicone (GAS RELIEF PO)   Oral   Take 1-2 tablets by mouth daily as needed (gas relief).          . cefUROXime (CEFTIN) 500 MG tablet   Oral   Take 1  tablet (500 mg total) by mouth 2 (two) times daily with a meal.   20 tablet   0   . dexamethasone (DECADRON) 2 MG tablet   Oral   Take 1-2 tablets (2-4 mg total) by mouth daily. Take 4mg  for 2days then 2mg  for 2days then STOP   6 tablet   0   . dexamethasone (DECADRON) 2 MG tablet      Take 2 tablets a day for 3 days then 1 tablet a day for 3 days   9 tablet   0   . levofloxacin (LEVAQUIN) 500 MG tablet   Oral   Take 1 tablet (500 mg total) by mouth daily. For 3 more days, use left of from your recent prescription      0   . levofloxacin (LEVAQUIN) 500 MG tablet   Oral   Take 1 tablet (500 mg total) by mouth daily. X 7 days   10 tablet   0    BP 120/67  Pulse 102  Temp(Src) 98.2 F (36.8 C) (Oral)  Resp 15  SpO2 99% Physical Exam  Constitutional: She is oriented to person, place, and time. She appears well-developed.  HENT:   Head: Normocephalic.  Eyes: Conjunctivae and EOM are normal. No scleral icterus.  Neck: Neck supple. No thyromegaly present.  Cardiovascular: Normal rate and regular rhythm.  Exam reveals no gallop and no friction rub.   No murmur heard. Pulmonary/Chest: No stridor. She has wheezes. She has no rales. She exhibits no tenderness.  Abdominal: She exhibits no distension. There is no tenderness. There is no rebound.  Musculoskeletal: Normal range of motion. She exhibits no edema.  Lymphadenopathy:    She has no cervical adenopathy.  Neurological: She is oriented to person, place, and time. She exhibits normal muscle tone. Coordination normal.  Skin: No rash noted. No erythema.  Psychiatric: She has a normal mood and affect. Her behavior is normal.    ED Course  Procedures (including critical care time) Labs Review Labs Reviewed  CBC WITH DIFFERENTIAL - Abnormal; Notable for the following:    WBC 11.1 (*)    Neutro Abs 8.4 (*)    All other components within normal limits  BASIC METABOLIC PANEL - Abnormal; Notable for the following:    CO2 36 (*)    Glucose, Bld 124 (*)    GFR calc non Af Amer 80 (*)    All other components within normal limits  CULTURE, BLOOD (ROUTINE X 2)  CULTURE, BLOOD (ROUTINE X 2)   Imaging Review Dg Chest Portable 1 View  05/08/2013   CLINICAL DATA:  Shortness of breath.  COPD.  EXAM: PORTABLE CHEST - 1 VIEW  COMPARISON:  DG CHEST 1V PORT dated 04/26/2013  FINDINGS: New airspace opacity is present in the right midlung overlying the right inferior scapular margin. Given the appearance compared to prior, this likely represents a small focus of pneumonia. Linear scarring is present in the lingula extend into the left costophrenic angle. Severe emphysema. Cardiopericardial silhouette and mediastinal contours are unchanged. Monitoring leads project over the chest.  IMPRESSION: New airspace opacity in the right midlung most compatible with pneumonia. Followup to ensure  radiographic clearing and exclude an underlying lesion is recommended. Typically clearing will be observed at 8 weeks.   Electronically Signed   By: Andreas Newport M.D.   On: 05/08/2013 16:37     EKG Interpretation None      MDM   Final diagnoses:  COPD (chronic obstructive pulmonary  disease)  Community acquired pneumonia    I spoke with dr. Karilyn Cota and it was decided the pt could be treated as an out pt.  Pt is to be seen at the pulmonologist tomorrow  The chart was scribed for me under my direct supervision.  I personally performed the history, physical, and medical decision making and all procedures in the evaluation of this patient.Benny Lennert, MD 05/08/13 [phone]

## 2013-05-08 NOTE — ED Notes (Signed)
Family at bedside. Family and patient asking why they are sending her home with pneumonia. States they usually keep her in the hospital.

## 2013-05-10 ENCOUNTER — Inpatient Hospital Stay (HOSPITAL_COMMUNITY)
Admission: AD | Admit: 2013-05-10 | Discharge: 2013-05-15 | DRG: 193 | Disposition: A | Discharge status: Skilled Nursing Facility | Payer: Medicare Other | Source: Ambulatory Visit | Attending: Internal Medicine | Admitting: Internal Medicine

## 2013-05-10 ENCOUNTER — Encounter (HOSPITAL_COMMUNITY): Payer: Self-pay

## 2013-05-10 DIAGNOSIS — J441 Chronic obstructive pulmonary disease with (acute) exacerbation: Secondary | ICD-10-CM | POA: Diagnosis present

## 2013-05-10 DIAGNOSIS — E876 Hypokalemia: Secondary | ICD-10-CM

## 2013-05-10 DIAGNOSIS — E0590 Thyrotoxicosis, unspecified without thyrotoxic crisis or storm: Secondary | ICD-10-CM | POA: Diagnosis present

## 2013-05-10 DIAGNOSIS — Z87891 Personal history of nicotine dependence: Secondary | ICD-10-CM

## 2013-05-10 DIAGNOSIS — M129 Arthropathy, unspecified: Secondary | ICD-10-CM | POA: Diagnosis present

## 2013-05-10 DIAGNOSIS — E43 Unspecified severe protein-calorie malnutrition: Secondary | ICD-10-CM | POA: Diagnosis present

## 2013-05-10 DIAGNOSIS — R197 Diarrhea, unspecified: Secondary | ICD-10-CM

## 2013-05-10 DIAGNOSIS — J449 Chronic obstructive pulmonary disease, unspecified: Secondary | ICD-10-CM | POA: Diagnosis present

## 2013-05-10 DIAGNOSIS — B370 Candidal stomatitis: Secondary | ICD-10-CM | POA: Diagnosis present

## 2013-05-10 DIAGNOSIS — J4489 Other specified chronic obstructive pulmonary disease: Secondary | ICD-10-CM

## 2013-05-10 DIAGNOSIS — Z9981 Dependence on supplemental oxygen: Secondary | ICD-10-CM

## 2013-05-10 DIAGNOSIS — J9620 Acute and chronic respiratory failure, unspecified whether with hypoxia or hypercapnia: Secondary | ICD-10-CM | POA: Diagnosis present

## 2013-05-10 DIAGNOSIS — J189 Pneumonia, unspecified organism: Principal | ICD-10-CM | POA: Diagnosis present

## 2013-05-10 DIAGNOSIS — F411 Generalized anxiety disorder: Secondary | ICD-10-CM | POA: Diagnosis present

## 2013-05-10 DIAGNOSIS — E079 Disorder of thyroid, unspecified: Secondary | ICD-10-CM

## 2013-05-10 DIAGNOSIS — J45901 Unspecified asthma with (acute) exacerbation: Secondary | ICD-10-CM

## 2013-05-10 DIAGNOSIS — Z681 Body mass index (BMI) 19 or less, adult: Secondary | ICD-10-CM

## 2013-05-10 DIAGNOSIS — R5381 Other malaise: Secondary | ICD-10-CM | POA: Diagnosis present

## 2013-05-10 LAB — COMPREHENSIVE METABOLIC PANEL
ALK PHOS: 57 U/L (ref 39–117)
ALT: 8 U/L (ref 0–35)
AST: 15 U/L (ref 0–37)
Albumin: 2.9 g/dL — ABNORMAL LOW (ref 3.5–5.2)
BILIRUBIN TOTAL: 0.2 mg/dL — AB (ref 0.3–1.2)
BUN: 15 mg/dL (ref 6–23)
CHLORIDE: 103 meq/L (ref 96–112)
CO2: 31 meq/L (ref 19–32)
Calcium: 9.4 mg/dL (ref 8.4–10.5)
Creatinine, Ser: 0.52 mg/dL (ref 0.50–1.10)
GLUCOSE: 118 mg/dL — AB (ref 70–99)
Potassium: 4.1 mEq/L (ref 3.7–5.3)
SODIUM: 143 meq/L (ref 137–147)
Total Protein: 5.8 g/dL — ABNORMAL LOW (ref 6.0–8.3)

## 2013-05-10 LAB — CBC
HCT: 33.9 % — ABNORMAL LOW (ref 36.0–46.0)
Hemoglobin: 11 g/dL — ABNORMAL LOW (ref 12.0–15.0)
MCH: 27.9 pg (ref 26.0–34.0)
MCHC: 32.4 g/dL (ref 30.0–36.0)
MCV: 86 fL (ref 78.0–100.0)
PLATELETS: 329 10*3/uL (ref 150–400)
RBC: 3.94 MIL/uL (ref 3.87–5.11)
RDW: 14.7 % (ref 11.5–15.5)
WBC: 11.8 10*3/uL — AB (ref 4.0–10.5)

## 2013-05-10 MED ORDER — METHIMAZOLE 5 MG PO TABS
2.5000 mg | ORAL_TABLET | ORAL | Status: DC
  Administered 2013-05-12 – 2013-05-14 (×2): 2.5 mg via ORAL
  Filled 2013-05-10 (×4): qty 1

## 2013-05-10 MED ORDER — ENOXAPARIN SODIUM 40 MG/0.4ML ~~LOC~~ SOLN: 40.0000 mg | SUBCUTANEOUS | Status: DC

## 2013-05-10 MED ORDER — ENOXAPARIN SODIUM 30 MG/0.3ML ~~LOC~~ SOLN: 30.0000 mg | SUBCUTANEOUS | Status: DC

## 2013-05-10 MED ORDER — SODIUM CHLORIDE 0.9 % IV SOLN
INTRAVENOUS | Status: DC
  Administered 2013-05-10: 75 mL/h via INTRAVENOUS
  Administered 2013-05-12: 04:00:00 via INTRAVENOUS
  Administered 2013-05-14: 75 mL/h via INTRAVENOUS
  Administered 2013-05-15: 05:00:00 via INTRAVENOUS

## 2013-05-10 MED ORDER — KETOROLAC TROMETHAMINE 0.5 % OP SOLN
1.0000 [drp] | Freq: Four times a day (QID) | OPHTHALMIC | Status: DC
  Administered 2013-05-11 – 2013-05-15 (×17): 1 [drp] via OPHTHALMIC
  Filled 2013-05-10: qty 5

## 2013-05-10 MED ORDER — ACETAMINOPHEN 650 MG RE SUPP
650.0000 mg | Freq: Four times a day (QID) | RECTAL | Status: DC | PRN
Start: 2013-05-10 — End: 2013-05-15

## 2013-05-10 MED ORDER — ACETAMINOPHEN 325 MG PO TABS
650.0000 mg | ORAL_TABLET | Freq: Four times a day (QID) | ORAL | Status: DC | PRN
  Administered 2013-05-10 – 2013-05-14 (×2): 650 mg via ORAL
  Filled 2013-05-10 (×3): qty 2

## 2013-05-10 MED ORDER — SODIUM CHLORIDE 0.9 % IV SOLN: 250.0000 mL | INTRAVENOUS | Status: DC | PRN

## 2013-05-10 MED ORDER — METHYLPREDNISOLONE SODIUM SUCC 125 MG IJ SOLR
60.0000 mg | Freq: Four times a day (QID) | INTRAMUSCULAR | Status: DC
  Administered 2013-05-10 – 2013-05-12 (×6): 60 mg via INTRAVENOUS
  Filled 2013-05-10 (×6): qty 2

## 2013-05-10 MED ORDER — IPRATROPIUM-ALBUTEROL 0.5-2.5 (3) MG/3ML IN SOLN
3.0000 mL | Freq: Four times a day (QID) | RESPIRATORY_TRACT | Status: DC
  Administered 2013-05-10 – 2013-05-11 (×5): 3 mL via RESPIRATORY_TRACT
  Filled 2013-05-10 (×5): qty 3

## 2013-05-10 MED ORDER — SODIUM CHLORIDE 0.9 % IJ SOLN
3.0000 mL | Freq: Two times a day (BID) | INTRAMUSCULAR | Status: DC
  Administered 2013-05-10 – 2013-05-15 (×7): 3 mL via INTRAVENOUS

## 2013-05-10 MED ORDER — SODIUM CHLORIDE 0.9 % IJ SOLN: 3.0000 mL | INTRAMUSCULAR | Status: DC | PRN

## 2013-05-10 MED ORDER — ENOXAPARIN SODIUM 30 MG/0.3ML ~~LOC~~ SOLN
30.0000 mg | SUBCUTANEOUS | Status: DC
  Administered 2013-05-10 – 2013-05-14 (×5): 30 mg via SUBCUTANEOUS
  Filled 2013-05-10 (×5): qty 0.3

## 2013-05-10 MED ORDER — LEVOFLOXACIN IN D5W 750 MG/150ML IV SOLN
750.0000 mg | INTRAVENOUS | Status: DC
  Administered 2013-05-10 – 2013-05-11 (×2): 750 mg via INTRAVENOUS
  Filled 2013-05-10 (×2): qty 150

## 2013-05-10 NOTE — H&P (Addendum)
PCP:   Catalina PizzaHALL, ZACH, MD   Chief Complaint:  Shortness of breath  HPI: 78 year old female who   has a past medical history of Arthritis; Asthma; COPD (chronic obstructive pulmonary disease); and Thyroid disorder (10/31/2010). patient has been having worsening shortness of breath, she was seen in the ED on 05/08/2013 and was sent home on antibiotics for diagnosis of pneumonia. Patient was seen by Dr. Juanetta GoslingHawkins in his clinic today and as patient continued to have shortness of breath he sent the patient to the hospital for admission. Patient has history of COPD and is on home oxygen, she says that breathing is worse on walking, she denies fever also has been having cough with white-colored phlegm. She denies nausea vomiting or diarrhea. Patient was started on Levaquin and she took her first dose yesterday. She had taken her dose of Levaquin today before coming to the hospital.   Allergies:   Allergies  Allergen Reactions  . Promist La [Pseudoephedrine-Guaifenesin] Shortness Of Breath  . Sulfa Antibiotics Nausea And Vomiting  . Aspirin Other (See Comments)    "FELT AS IF SHE WOULD DIE"  . Iodine Other (See Comments)    FELT "BAD" EXAUSTED   . Latex Itching    PLASTIC BANDAIDS  . Prednisone Palpitations    SPEEDS HEART RATE      Past Medical History  Diagnosis Date  . Arthritis   . Asthma   . COPD (chronic obstructive pulmonary disease)   . Thyroid disorder 10/31/2010    Past Surgical History  Procedure Laterality Date  . Abdominal hysterectomy      Prior to Admission medications   Medication Sig Start Date End Date Taking? Authorizing Provider  albuterol (PROVENTIL HFA;VENTOLIN HFA) 108 (90 BASE) MCG/ACT inhaler Inhale 2 puffs into the lungs every 4 (four) hours as needed for wheezing or shortness of breath. 04/30/13  Yes Zannie CovePreetha Joseph, MD  albuterol (PROVENTIL) (2.5 MG/3ML) 0.083% nebulizer solution Take 2.5 mg by nebulization every 6 (six) hours as needed for wheezing or  shortness of breath.   Yes Historical Provider, MD  cholecalciferol (VITAMIN D) 1000 UNITS tablet Take 1,000 Units by mouth daily.     Yes Historical Provider, MD  dexamethasone (DECADRON) 2 MG tablet Take 1-2 tablets (2-4 mg total) by mouth daily. Take 4mg  for 2days then 2mg  for 2days then STOP 04/30/13  Yes Zannie CovePreetha Joseph, MD  ketorolac (ACULAR) 0.4 % SOLN Place 1 drop into both eyes 4 (four) times daily.   Yes Historical Provider, MD  levofloxacin (LEVAQUIN) 500 MG tablet Take 1 tablet (500 mg total) by mouth daily. For 3 more days, use left of from your recent prescription 04/30/13  Yes Zannie CovePreetha Joseph, MD  methimazole (TAPAZOLE) 5 MG tablet Take 2.5 mg by mouth every other day.    Yes Historical Provider, MD  omeprazole (PRILOSEC) 20 MG capsule Take 1 capsule by mouth daily. 12/13/12  Yes Historical Provider, MD  OXYGEN-HELIUM IN Inhale 2 L into the lungs continuous.   Yes Historical Provider, MD  Simethicone (GAS RELIEF PO) Take 1-2 tablets by mouth daily as needed (gas relief).    Yes Historical Provider, MD  cefUROXime (CEFTIN) 500 MG tablet Take 1 tablet (500 mg total) by mouth 2 (two) times daily with a meal. 05/08/13   Benny LennertJoseph L Zammit, MD  dexamethasone (DECADRON) 2 MG tablet Take 2 tablets a day for 3 days then 1 tablet a day for 3 days 05/08/13   Benny LennertJoseph L Zammit, MD  levofloxacin (LEVAQUIN) 500  MG tablet Take 1 tablet (500 mg total) by mouth daily. X 7 days 05/08/13   Benny Lennert, MD    Social History:  reports that she has quit smoking. She does not have any smokeless tobacco history on file. She reports that she does not drink alcohol or use illicit drugs.    All the positives are listed in BOLD  Review of Systems:  HEENT: Headache, blurred vision, runny nose, sore throat Neck: Hypothyroidism, hyperthyroidism,,lymphadenopathy Chest : Shortness of breath, history of COPD, Asthma Heart : Chest pain, history of coronary arterey disease GI:  Nausea, vomiting, diarrhea, constipation,  GERD GU: Dysuria, urgency, frequency of urination, hematuria Neuro: Stroke, seizures, syncope Psych: Depression, anxiety, hallucinations   Physical Exam: Blood pressure 149/83, pulse 103, temperature 98.2 F (36.8 C), temperature source Oral, resp. rate 18, height 5\' 2"  (1.575 m), weight 36.968 kg (81 lb 8 oz), SpO2 100.00%. Constitutional:   Patient is a well-developed and well-nourished female in no acute distress and cooperative with exam. Head: Normocephalic and atraumatic Mouth: Mucus membranes moist Eyes: PERRL, EOMI, conjunctivae normal Neck: Supple, No Thyromegaly Cardiovascular: RRR, S1 normal, S2 normal Pulmonary/Chest: Decreased breath sounds bilaterally Abdominal: Soft. Non-tender, non-distended, bowel sounds are normal, no masses, organomegaly, or guarding present.  Neurological: A&O x3, Strenght is normal and symmetric bilaterally, cranial nerve II-XII are grossly intact, no focal motor deficit, sensory intact to light touch bilaterally.  Extremities : No Cyanosis, Clubbing or Edema    Assessment/Plan Active Problems:   Respiratory failure, acute and chronic   COPD (chronic obstructive pulmonary disease)   Pneumonia  Community acquired pneumonia Patient will be started on Levaquin per pharmacy consultation Blood cultures x2 were obtained on 05/08/13, with no growth in 2 days in final result pending Will obtain urinary strep antigen as well as Legionella antigen.  COPD Patient does have COPD exacerbation with decreased breath sounds bilaterally Will start her on Solu-Medrol 60 mg IV every 6 hours and DuoNeb nebulizers every 6 hours scheduled.  Hyperthyroidism Continue methimazole  Code status: Patient is full code  Family discussion: No family at bedside   Time Spent on Admission: 65 minutes  Meredeth Ide [facility] Pager: [phone] 05/10/2013, 8:26 PM  If 7PM-7AM, please contact night-coverage  [email or web address]  Password [email or web address]

## 2013-05-10 NOTE — Progress Notes (Signed)
ANTIBIOTIC CONSULT NOTE - INITIAL  Pharmacy Consult for Levaquin Indication: pneumonia  Allergies  Allergen Reactions  . Promist La [Pseudoephedrine-Guaifenesin] Shortness Of Breath  . Sulfa Antibiotics Nausea And Vomiting  . Aspirin Other (See Comments)    "FELT AS IF SHE WOULD DIE"  . Iodine Other (See Comments)    FELT "BAD" EXAUSTED   . Latex Itching    PLASTIC BANDAIDS  . Prednisone Palpitations    SPEEDS HEART RATE    Patient Measurements: Height: 5\' 2"  (157.5 cm) Weight: 81 lb 8 oz (36.968 kg) IBW/kg (Calculated) : 50.1  Vital Signs: Temp: 98.2 F (36.8 C) (04/08 1632) Temp src: Oral (04/08 1632) BP: 149/83 mmHg (04/08 1632) Pulse Rate: 103 (04/08 1632) Intake/Output from previous day:   Intake/Output from this shift:    Labs:  Recent Labs  05/08/13 1636  WBC 11.1*  HGB 12.5  PLT 314  CREATININE 0.76   Estimated Creatinine Clearance: 35.5 ml/min (by C-G formula based on Cr of 0.76). No results found for this basename: VANCOTROUGH, VANCOPEAK, VANCORANDOM, GENTTROUGH, GENTPEAK, GENTRANDOM, TOBRATROUGH, TOBRAPEAK, TOBRARND, AMIKACINPEAK, AMIKACINTROU, AMIKACIN,  in the last 72 hours   Microbiology: Recent Results (from the past 720 hour(s))  CULTURE, BLOOD (ROUTINE X 2)     Status: None   Collection Time    05/08/13  7:17 PM      Result Value Ref Range Status   Specimen Description LEFT ANTECUBITAL   Final   Special Requests BOTTLES DRAWN AEROBIC AND ANAEROBIC 10CC   Final   Culture NO GROWTH 2 DAYS   Final   Report Status PENDING   Incomplete  CULTURE, BLOOD (ROUTINE X 2)     Status: None   Collection Time    05/08/13  7:20 PM      Result Value Ref Range Status   Specimen Description RIGHT ANTECUBITAL   Final   Special Requests BOTTLES DRAWN AEROBIC AND ANAEROBIC 10CC   Final   Culture NO GROWTH 2 DAYS   Final   Report Status PENDING   Incomplete    Medical History: Past Medical History  Diagnosis Date  . Arthritis   . Asthma   . COPD  (chronic obstructive pulmonary disease)   . Thyroid disorder 10/31/2010    Medications:  Scheduled:  . enoxaparin (LOVENOX) injection  30 mg Subcutaneous Q24H  . ipratropium-albuterol  3 mL Nebulization Q6H  . ketorolac  1 drop Both Eyes QID  . methimazole  2.5 mg Oral QODAY  . methylPREDNISolone (SOLU-MEDROL) injection  60 mg Intravenous Q6H  . sodium chloride  3 mL Intravenous Q12H   Assessment: 78 yo F with CAP. She was seen in the ED on 4/6 and discharged with Ceftin bid & Levaquin 500mg  po daily.  She reported started taking these yesterday & had doses this morning.  Due to worsening shortness of breath, she was referred for admission.  Renal function is at patient's baseline.  Estimated CrCl ~ 5935ml/min.    Levaquin 4/7>>  Goal of Therapy:  Eradicate infection.  Plan:  Levaquin 750mg  IV q48h- start in am since patient had oral dose daily x past 2 days (Levaquin po 100% BA)   Monitor renal function and cx data   Mercy Ridingndrea Michelle Walter Min 05/10/2013,9:00 PM

## 2013-05-11 DIAGNOSIS — J441 Chronic obstructive pulmonary disease with (acute) exacerbation: Secondary | ICD-10-CM

## 2013-05-11 DIAGNOSIS — E079 Disorder of thyroid, unspecified: Secondary | ICD-10-CM

## 2013-05-11 DIAGNOSIS — J9620 Acute and chronic respiratory failure, unspecified whether with hypoxia or hypercapnia: Secondary | ICD-10-CM

## 2013-05-11 LAB — CBC
HEMATOCRIT: 34.1 % — AB (ref 36.0–46.0)
HEMOGLOBIN: 11 g/dL — AB (ref 12.0–15.0)
MCH: 27.9 pg (ref 26.0–34.0)
MCHC: 32.3 g/dL (ref 30.0–36.0)
MCV: 86.5 fL (ref 78.0–100.0)
Platelets: 353 10*3/uL (ref 150–400)
RBC: 3.94 MIL/uL (ref 3.87–5.11)
RDW: 14.7 % (ref 11.5–15.5)
WBC: 9.7 10*3/uL (ref 4.0–10.5)

## 2013-05-11 LAB — COMPREHENSIVE METABOLIC PANEL
ALBUMIN: 2.8 g/dL — AB (ref 3.5–5.2)
ALK PHOS: 57 U/L (ref 39–117)
ALT: 8 U/L (ref 0–35)
AST: 15 U/L (ref 0–37)
BILIRUBIN TOTAL: 0.3 mg/dL (ref 0.3–1.2)
BUN: 13 mg/dL (ref 6–23)
CO2: 31 mEq/L (ref 19–32)
Calcium: 9.2 mg/dL (ref 8.4–10.5)
Chloride: 103 mEq/L (ref 96–112)
Creatinine, Ser: 0.52 mg/dL (ref 0.50–1.10)
GFR calc Af Amer: >90 mL/min (ref >90)
GFR calc non Af Amer: >90 mL/min (ref >90)
Glucose, Bld: 102 mg/dL — ABNORMAL HIGH (ref 70–99)
POTASSIUM: 3.8 meq/L (ref 3.7–5.3)
Sodium: 143 mEq/L (ref 137–147)
Total Protein: 5.8 g/dL — ABNORMAL LOW (ref 6.0–8.3)

## 2013-05-11 LAB — STREP PNEUMONIAE URINARY ANTIGEN: Strep Pneumo Urinary Antigen: NEGATIVE

## 2013-05-11 MED ORDER — ALBUTEROL SULFATE (2.5 MG/3ML) 0.083% IN NEBU
INHALATION_SOLUTION | RESPIRATORY_TRACT | Status: AC
  Administered 2013-05-11: 2.5 mg
  Filled 2013-05-11: qty 3

## 2013-05-11 MED ORDER — PRO-STAT SUGAR FREE PO LIQD
30.0000 mL | Freq: Three times a day (TID) | ORAL | Status: DC
  Administered 2013-05-11 – 2013-05-15 (×5): 30 mL via ORAL
  Filled 2013-05-11 (×7): qty 30

## 2013-05-11 MED ORDER — SIMETHICONE 80 MG PO CHEW
160.0000 mg | CHEWABLE_TABLET | Freq: Four times a day (QID) | ORAL | Status: DC | PRN
  Administered 2013-05-11 – 2013-05-14 (×5): 160 mg via ORAL
  Filled 2013-05-11 (×5): qty 2

## 2013-05-11 MED ORDER — IPRATROPIUM-ALBUTEROL 0.5-2.5 (3) MG/3ML IN SOLN
3.0000 mL | RESPIRATORY_TRACT | Status: DC
  Administered 2013-05-11 – 2013-05-12 (×2): 3 mL via RESPIRATORY_TRACT
  Filled 2013-05-11 (×2): qty 3

## 2013-05-11 MED ORDER — ALBUTEROL SULFATE (2.5 MG/3ML) 0.083% IN NEBU
2.5000 mg | INHALATION_SOLUTION | RESPIRATORY_TRACT | Status: DC | PRN
  Administered 2013-05-12 – 2013-05-13 (×4): 2.5 mg via RESPIRATORY_TRACT
  Filled 2013-05-11 (×5): qty 3

## 2013-05-11 MED ORDER — SIMETHICONE 80 MG PO CHEW
80.0000 mg | CHEWABLE_TABLET | Freq: Four times a day (QID) | ORAL | Status: DC | PRN
  Administered 2013-05-11: 80 mg via ORAL
  Filled 2013-05-11: qty 1

## 2013-05-11 NOTE — Progress Notes (Signed)
PT states she takes her neb treatments at home every 4 hours, she has been taking duo neb every 6 hrs which she states is too long between nebs. Also Atrovent gave an allergy warning , but since she has received numerous duo nebs txs this appears not be a problem.

## 2013-05-11 NOTE — Care Management Note (Addendum)
    Page 1 of 2   05/15/2013     2:32:48 PM   CARE MANAGEMENT NOTE 05/15/2013  Patient:  [name],[name]   Account Number:  [id number]  Date Initiated:  05/11/2013  Documentation initiated by:  Sharrie RothmanBLACKWELL,TAMMY C  Subjective/Objective Assessment:   Pt admitted from home with pneumonia. Pt lives alone and has a niece that checks on pt frequently. Pt has home O2 and a walker, cane, and w/c. [facility]     Action/Plan:   CM spoke with pt about [facility] involvement at discharge and she wants to talk to her niece about this. Will make referral if pt agrees. [facility] (per pts choice).   Anticipated DC Date:  05/15/2013   Anticipated DC Plan:  HOME W HOME HEALTH SERVICES  In-house referral  Clinical Social Worker      DC Planning Services  CM consult      [facility]  Resumption Of Svcs/PTA Provider   Choice offered to / List presented to:  C-1 Patient        HH arranged  HH-1 RN  HH-2 PT      [facility]   Status of service:  Completed, signed off Medicare Important Message given?  YES (If response is "NO", the following Medicare IM given date fields will be blank) Date Medicare IM given:  05/12/2013 Date Additional Medicare IM given:    Discharge Disposition:    Per UR Regulation:    If discussed at Long Length of Stay Meetings, dates discussed:    Comments:  05/15/13 Rosemary HolmsAmy Gaynor Genco RN BSN CM Pt considering SNF. CSW assisting in search for placement 2:30 [facility]  05/12/13 1525 Arlyss Queenammy Blackwell, RN BSN CM Pt for discharge 4/11/5. CM [facility] (per pts [facility]). Alroy BailiffLinda Lothian [facility] is aware and will collect the pts information from the chart. Hh services to resume within 48 hours of discharge. [facility] made and pt is agreeable to services. No DME needs noted. Pt and pts nurse aware of discharge arrangements.  05/11/13 Arlyss Queenammy Blackwell, RN BSN  CM

## 2013-05-11 NOTE — Care Management Utilization Note (Signed)
UR completed 

## 2013-05-11 NOTE — Progress Notes (Signed)
INITIAL NUTRITION ASSESSMENT  DOCUMENTATION CODES Per approved criteria  -Severe malnutrition in the context of chronic illness   Pt meets criteria for severe MALNUTRITION in the context of chronic illness as evidenced by ,7% of estimated energy intake x 1 month, severe muscle depletion.  INTERVENTION: 30 ml Prostat TID  NUTRITION DIAGNOSIS: Inadequate oral intake related to decreased appetite as evidenced by continued weight loss, severe fat depletion.   Goal: Pt will meet >90% of estimated nutritional needs  Monitor:  PO intake, labs, skin assessments, weight changes, I/O's  Reason for Assessment: low BMI (14.9)  78 y.o. female  Admitting Dx: <principal problem not specified>  ASSESSMENT: Pt admitted with CAP. Pt is familiar to this RD due to recent previous admission. Noted pt was d/c from [facility] on 04/30/13 after being treated for COPD exacerbation. During this hospitalization, pt was diagnosed with severe malnutrition and PO's were variable (PO: 20-75%).  Since previous discharge, pt has continued to lose weight (1.2% x 1 week). Wt hx also reveals a 5.3% wt loss x 3 months. Suspect continued poor po intake given wt loss.  Unable to arouse pt at time of visit and no family present.  Nutrition Focused Physical Exam:  Subcutaneous Fat:  Orbital Region: severe depletion Upper Arm Region: did not assess Thoracic and Lumbar Region: did not assess  Muscle:  Temple Region: severe depletion Clavicle Bone Region: severe depletion Clavicle and Acromion Bone Region: did not assess Scapular Bone Region: did not assess Dorsal Hand: did not assess Patellar Region: did not assess Anterior Thigh Region: did not assess Posterior Calf Region: did not assess  Edema: did not assess   Height: Ht Readings from Last 1 Encounters:  05/10/13 5\' 2"  (1.575 m)    Weight: Wt Readings from Last 1 Encounters:  05/10/13 81 lb 8 oz (36.968 kg)    Ideal Body Weight: 105#  % Ideal Body  Weight: 77%  Wt Readings from Last 10 Encounters:  05/10/13 81 lb 8 oz (36.968 kg)  04/30/13 82 lb (37.195 kg)  04/23/13 86 lb (39.009 kg)  02/28/13 86 lb (39.009 kg)  11/06/10 85 lb 1.6 oz (38.6 kg)    Usual Body Weight: 85#  % Usual Body Weight: 95%  BMI:  Body mass index is 14.9 kg/(m^2). Meets criteria for underweight.   Estimated Nutritional Needs: Kcal: [id number] daily Protein: 37-44 grams daily Fluid: 1.1-1.3 L daily  Skin: Intact  Diet Order: General  EDUCATION NEEDS: -Education not appropriate at this time   Intake/Output Summary (Last 24 hours) at 05/11/13 1633 Last data filed at 05/11/13 0500  Gross per 24 hour  Intake    650 ml  Output    100 ml  Net    550 ml    Last BM: 05/10/13   Labs:   Recent Labs Lab 05/08/13 1636 05/10/13 2100 05/11/13 0456  NA 146 143 143  K 4.0 4.1 3.8  CL 104 103 103  CO2 36* 31 31  BUN 14 15 13   CREATININE 0.76 0.52 0.52  CALCIUM 9.5 9.4 9.2  GLUCOSE 124* 118* 102*    CBG (last 3)  No results found for this basename: GLUCAP,  in the last 72 hours  Scheduled Meds: . enoxaparin (LOVENOX) injection  30 mg Subcutaneous Q24H  . ipratropium-albuterol  3 mL Nebulization Q6H  . ketorolac  1 drop Both Eyes QID  . levofloxacin (LEVAQUIN) IV  750 mg Intravenous Q48H  . [START ON 05/12/2013] methimazole  2.5 mg Oral QODAY  .  methylPREDNISolone (SOLU-MEDROL) injection  60 mg Intravenous Q6H  . sodium chloride  3 mL Intravenous Q12H    Continuous Infusions: . sodium chloride 75 mL/hr (05/10/13 2153)    Past Medical History  Diagnosis Date  . Arthritis   . Asthma   . COPD (chronic obstructive pulmonary disease)   . Thyroid disorder 10/31/2010    Past Surgical History  Procedure Laterality Date  . Abdominal hysterectomy      Alissandra Geoffroy A. Mayford Knife, RD, LDN Pager: [phone]

## 2013-05-11 NOTE — Progress Notes (Signed)
[facility] PROGRESS NOTE  [name] RUE:[id number] DOB: 06/08/1935 DOA: 05/10/2013 PCP: Catalina PizzaHALL, ZACH, MD  Assessment/Plan: 1. Acute on chronic respiratory failure. Appears to be improving but not quite at baseline. 2. Community acquired pneumonia. Continue on levofloxacin. Patient is afebrile. Followup urinary antigens. 3. Exacerbation of COPD. Continue on steroids, bronchodilators. Patient is continued to wheeze. 4. Hyperthyroidism. Continue methimazole  Code Status: full Family Communication: discussed with patient Disposition Plan: discharge home once improved   Consultants:  Pulmonology  Procedures:    Antibiotics:  Levofloxacin 4/9>>   HPI/Subjective: Feeling better than admission, non productive cough  Objective: Filed Vitals:   05/11/13 1443  BP: 110/62  Pulse: 92  Temp: 98 F (36.7 C)  Resp: 20    Intake/Output Summary (Last 24 hours) at 05/11/13 1942 Last data filed at 05/11/13 1700  Gross per 24 hour  Intake   1250 ml  Output    800 ml  Net    450 ml   Filed Weights   05/10/13 1632  Weight: 36.968 kg (81 lb 8 oz)    Exam:   General:  NAD  Cardiovascular: S1, S2 RRR  Respiratory: bilateral exp wheezing  Abdomen: soft, nt, nd, bs+  Musculoskeletal: no edema b/l   Data Reviewed: Basic Metabolic Panel:  Recent Labs Lab 05/08/13 1636 05/10/13 2100 05/11/13 0456  NA 146 143 143  K 4.0 4.1 3.8  CL 104 103 103  CO2 36* 31 31  GLUCOSE 124* 118* 102*  BUN 14 15 13   CREATININE 0.76 0.52 0.52  CALCIUM 9.5 9.4 9.2   Liver Function Tests:  Recent Labs Lab 05/10/13 2100 05/11/13 0456  AST 15 15  ALT 8 8  ALKPHOS 57 57  BILITOT 0.2* 0.3  PROT 5.8* 5.8*  ALBUMIN 2.9* 2.8*   No results found for this basename: LIPASE, AMYLASE,  in the last 168 hours No results found for this basename: AMMONIA,  in the last 168 hours CBC:  Recent Labs Lab 05/08/13 1636 05/10/13 2100 05/11/13 0456  WBC 11.1* 11.8* 9.7  NEUTROABS  8.4*  --   --   HGB 12.5 11.0* 11.0*  HCT 40.0 33.9* 34.1*  MCV 88.9 86.0 86.5  PLT 314 329 353   Cardiac Enzymes: No results found for this basename: CKTOTAL, CKMB, CKMBINDEX, TROPONINI,  in the last 168 hours BNP (last 3 results)  Recent Labs  02/28/13 1335 04/26/13 1503  PROBNP 76.1 95.5   CBG: No results found for this basename: GLUCAP,  in the last 168 hours  Recent Results (from the past 240 hour(s))  CULTURE, BLOOD (ROUTINE X 2)     Status: None   Collection Time    05/08/13  7:17 PM      Result Value Ref Range Status   Specimen Description LEFT ANTECUBITAL   Final   Special Requests BOTTLES DRAWN AEROBIC AND ANAEROBIC 10CC   Final   Culture NO GROWTH 3 DAYS   Final   Report Status PENDING   Incomplete  CULTURE, BLOOD (ROUTINE X 2)     Status: None   Collection Time    05/08/13  7:20 PM      Result Value Ref Range Status   Specimen Description RIGHT ANTECUBITAL   Final   Special Requests BOTTLES DRAWN AEROBIC AND ANAEROBIC 10CC   Final   Culture NO GROWTH 3 DAYS   Final   Report Status PENDING   Incomplete     Studies: No results found.  Scheduled Meds: .  enoxaparin (LOVENOX) injection  30 mg Subcutaneous Q24H  . feeding supplement (PRO-STAT SUGAR FREE 64)  30 mL Oral TID WC  . ipratropium-albuterol  3 mL Nebulization Q6H  . ketorolac  1 drop Both Eyes QID  . levofloxacin (LEVAQUIN) IV  750 mg Intravenous Q48H  . [START ON 05/12/2013] methimazole  2.5 mg Oral QODAY  . methylPREDNISolone (SOLU-MEDROL) injection  60 mg Intravenous Q6H  . sodium chloride  3 mL Intravenous Q12H   Continuous Infusions: . sodium chloride 75 mL/hr (05/10/13 2153)    Active Problems:   Respiratory failure, acute and chronic   COPD (chronic obstructive pulmonary disease)   Pneumonia    Time spent:    [employer]  [facility] Pager [phone]. If 7PM-7AM, please contact night-coverage at [email or web address], password [facility] 05/11/2013, 7:42 PM  LOS: 1 day

## 2013-05-12 DIAGNOSIS — E43 Unspecified severe protein-calorie malnutrition: Secondary | ICD-10-CM

## 2013-05-12 DIAGNOSIS — E876 Hypokalemia: Secondary | ICD-10-CM

## 2013-05-12 LAB — LEGIONELLA ANTIGEN, URINE: Legionella Antigen, Urine: NEGATIVE

## 2013-05-12 MED ORDER — ALBUTEROL SULFATE (2.5 MG/3ML) 0.083% IN NEBU
2.5000 mg | INHALATION_SOLUTION | RESPIRATORY_TRACT | Status: DC
  Administered 2013-05-12 – 2013-05-15 (×17): 2.5 mg via RESPIRATORY_TRACT
  Filled 2013-05-12 (×17): qty 3

## 2013-05-12 MED ORDER — PREDNISONE 20 MG PO TABS
50.0000 mg | ORAL_TABLET | Freq: Every day | ORAL | Status: DC
  Administered 2013-05-12 – 2013-05-15 (×4): 50 mg via ORAL
  Filled 2013-05-12: qty 1
  Filled 2013-05-12: qty 2
  Filled 2013-05-12: qty 1
  Filled 2013-05-12: qty 2
  Filled 2013-05-12 (×2): qty 1
  Filled 2013-05-12 (×2): qty 2

## 2013-05-12 MED ORDER — ONDANSETRON HCL 4 MG/2ML IJ SOLN
4.0000 mg | Freq: Four times a day (QID) | INTRAMUSCULAR | Status: DC | PRN
  Administered 2013-05-12: 4 mg via INTRAVENOUS
  Filled 2013-05-12 (×2): qty 2

## 2013-05-12 MED ORDER — MOMETASONE FURO-FORMOTEROL FUM 200-5 MCG/ACT IN AERO
2.0000 | INHALATION_SPRAY | Freq: Two times a day (BID) | RESPIRATORY_TRACT | Status: DC
  Administered 2013-05-12 – 2013-05-15 (×7): 2 via RESPIRATORY_TRACT
  Filled 2013-05-12: qty 8.8

## 2013-05-12 MED ORDER — TIOTROPIUM BROMIDE MONOHYDRATE 18 MCG IN CAPS
18.0000 ug | ORAL_CAPSULE | Freq: Every day | RESPIRATORY_TRACT | Status: DC
  Administered 2013-05-12 – 2013-05-14 (×3): 18 ug via RESPIRATORY_TRACT
  Filled 2013-05-12: qty 5

## 2013-05-12 MED ORDER — LEVOFLOXACIN 750 MG PO TABS
750.0000 mg | ORAL_TABLET | Freq: Every day | ORAL | Status: DC
  Administered 2013-05-12 – 2013-05-14 (×3): 750 mg via ORAL
  Filled 2013-05-12 (×4): qty 1

## 2013-05-12 MED ORDER — ALPRAZOLAM 0.25 MG PO TABS
0.2500 mg | ORAL_TABLET | Freq: Three times a day (TID) | ORAL | Status: DC | PRN
  Administered 2013-05-12 – 2013-05-14 (×3): 0.25 mg via ORAL
  Filled 2013-05-12 (×4): qty 1

## 2013-05-12 NOTE — Progress Notes (Signed)
Patient evaluated for community based chronic disease management services with [facility] as a benefit of patient's [employer].  Spoke with patient via phone to explain [facility].  Services have been accepted.  Patient will receive a post discharge transition of care call and will be evaluated for monthly home visits for assessments and disease process education.  Made Inpatient Case Manager aware that [facility]. Of note, [facility] does not replace or interfere with any services that are arranged by inpatient case management or social work.  For additional questions or referrals please contact Anibal Hendersonim Henderson BSN RN [facility] Liaison at [phone].

## 2013-05-12 NOTE — Progress Notes (Signed)
[facility] PROGRESS NOTE  Assessment/Plan: Acute on chronic respiratory failure: - Appears to be improving but not quite at baseline. - ambulate and check saturation.  Community acquired pneumonia: - Continue on levofloxacin, started on 4.8.2015 - Patient is afebrile.  - change to orals.  Exacerbation of COPD: - long history of smoking. - Continue on steroids and change to orals. -  bronchodilators. Minimal wheezing. - Add Spiriva, cont advair. - add low dose xanax. Pt is very anxious.  Hyperthyroidism.  - Continue methimazole    Code Status: full  Family Communication: discussed with patient  Disposition Plan: discharge home once improved    Consultants:  none  Procedures:  CXR  Antibiotics:  levaquin 4.8.2015  HPI/Subjective: Pt relates SOB improved  Objective: Filed Vitals:   05/11/13 2127 05/11/13 2339 05/12/13 0343 05/12/13 0546  BP: 109/71   107/69  Pulse: 104   80  Temp: 97.7 F (36.5 C)   97.9 F (36.6 C)  TempSrc: Oral   Oral  Resp: 20   13  Height:      Weight:      SpO2: 98% 98% 98% 100%    Intake/Output Summary (Last 24 hours) at 05/12/13 0739 Last data filed at 05/12/13 0334  Gross per 24 hour  Intake   2460 ml  Output    700 ml  Net   1760 ml   Filed Weights   05/10/13 1632  Weight: 36.968 kg (81 lb 8 oz)    Exam:  General: Alert, awake, oriented x3, in no acute distress.  HEENT: No bruits, no goiter.  Heart: Regular rate and rhythm, without murmurs, rubs, gallops.  Lungs: Good air movement, minimla wheezing Abdomen: Soft, nontender, nondistended, positive bowel sounds.    Data Reviewed: Basic Metabolic Panel:  Recent Labs Lab 05/08/13 1636 05/10/13 2100 05/11/13 0456  NA 146 143 143  K 4.0 4.1 3.8  CL 104 103 103  CO2 36* 31 31  GLUCOSE 124* 118* 102*  BUN 14 15 13   CREATININE 0.76 0.52 0.52  CALCIUM 9.5 9.4 9.2   Liver Function Tests:  Recent Labs Lab 05/10/13 2100 05/11/13 0456  AST  15 15  ALT 8 8  ALKPHOS 57 57  BILITOT 0.2* 0.3  PROT 5.8* 5.8*  ALBUMIN 2.9* 2.8*   No results found for this basename: LIPASE, AMYLASE,  in the last 168 hours No results found for this basename: AMMONIA,  in the last 168 hours CBC:  Recent Labs Lab 05/08/13 1636 05/10/13 2100 05/11/13 0456  WBC 11.1* 11.8* 9.7  NEUTROABS 8.4*  --   --   HGB 12.5 11.0* 11.0*  HCT 40.0 33.9* 34.1*  MCV 88.9 86.0 86.5  PLT 314 329 353   Cardiac Enzymes: No results found for this basename: CKTOTAL, CKMB, CKMBINDEX, TROPONINI,  in the last 168 hours BNP (last 3 results)  Recent Labs  02/28/13 1335 04/26/13 1503  PROBNP 76.1 95.5   CBG: No results found for this basename: GLUCAP,  in the last 168 hours  Recent Results (from the past 240 hour(s))  CULTURE, BLOOD (ROUTINE X 2)     Status: None   Collection Time    05/08/13  7:17 PM      Result Value Ref Range Status   Specimen Description LEFT ANTECUBITAL   Final   Special Requests BOTTLES DRAWN AEROBIC AND ANAEROBIC 10CC   Final   Culture NO GROWTH 3 DAYS   Final   Report Status PENDING  Incomplete  CULTURE, BLOOD (ROUTINE X 2)     Status: None   Collection Time    05/08/13  7:20 PM      Result Value Ref Range Status   Specimen Description RIGHT ANTECUBITAL   Final   Special Requests BOTTLES DRAWN AEROBIC AND ANAEROBIC 10CC   Final   Culture NO GROWTH 3 DAYS   Final   Report Status PENDING   Incomplete     Studies: No results found.  Scheduled Meds: . enoxaparin (LOVENOX) injection  30 mg Subcutaneous Q24H  . feeding supplement (PRO-STAT SUGAR FREE 64)  30 mL Oral TID WC  . ipratropium-albuterol  3 mL Nebulization Q4H WA  . ketorolac  1 drop Both Eyes QID  . levofloxacin (LEVAQUIN) IV  750 mg Intravenous Q48H  . methimazole  2.5 mg Oral QODAY  . methylPREDNISolone (SOLU-MEDROL) injection  60 mg Intravenous Q6H  . sodium chloride  3 mL Intravenous Q12H   Continuous Infusions: . sodium chloride 75 mL/hr at 05/12/13 0334       Marinda ElkAbraham Feliz Ortiz  [facility] Pager [phone]. If 8PM-8AM, please contact night-coverage at [email or web address], password [email or web address] 05/12/2013, 7:39 AM  LOS: 2 days

## 2013-05-12 NOTE — Clinical Documentation Improvement (Signed)
   Please review Registered Dietician Consult dated 05/11/2013 at 4:41pm.   Possible Clinical Conditions:   - Severe Malnutrition   - Other Condition   - Unable to Clinically Determine       Thank You,  Jerral Ralphathy R Harrington Jobe ,RN BSN CCDS Clinical Documentation Specialist:  [phone] [facility] Information Management

## 2013-05-12 NOTE — Consult Note (Signed)
Consult requested by:Dr. Memon Consult requested for COPD exacerbation/pneumonia:  HPI: This is a 78 year old who has a long known history of severe COPD. She also has hyperthyroidism which is being treated. She had been in the hospital last month was discharged came back to the emergency department with pneumonia. She was treated as an outpatient and then came to my office after that on 2 consecutive days with increasing shortness of breath. She was admitted after the second days visit. She is known to have severe COPD. She says she's having a lot of trouble with anxiety. She is still short of breath and coughing but not as badly  Past Medical History  Diagnosis Date  . Arthritis   . Asthma   . COPD (chronic obstructive pulmonary disease)   . Thyroid disorder 10/31/2010     History reviewed. No pertinent family history.   History   Social History  . Marital Status: Widowed    Spouse Name: N/A    Number of Children: N/A  . Years of Education: N/A   Social History Main Topics  . Smoking status: Former Games developer  . Smokeless tobacco: None  . Alcohol Use: No  . Drug Use: No  . Sexual Activity: No   Other Topics Concern  . None   Social History Narrative  . None     ROS: She denies any hemoptysis chest pain abdominal pain nausea vomiting and these review of systems is otherwise negative    Objective: Vital signs in last 24 hours: Temp:  [97.7 F (36.5 C)-98 F (36.7 C)] 97.9 F (36.6 C) (04/10 0546) Pulse Rate:  [80-104] 80 (04/10 0546) Resp:  [13-20] 13 (04/10 0546) BP: (107-110)/(62-71) 107/69 mmHg (04/10 0546) SpO2:  [97 %-100 %] 98 % (04/10 0751) Weight change:  Last BM Date: 05/11/13  Intake/Output from previous day: 04/09 0701 - 04/10 0700 In: 2460 [P.O.:600; I.V.:1860] Out: 700 [Urine:700]  PHYSICAL EXAM She is very thin. She looks short of breath at rest but I think she's about back to baseline. Her HEENT exam is unremarkable. Her neck is supple. Her  chest shows diminished breath sounds and prolonged expiratory phase. Her heart is regular without gallop. Her abdomen is soft. Extremities showed no edema.  Lab Results: Basic Metabolic Panel:  Recent Labs  16/10/96 2100 05/11/13 0456  NA 143 143  K 4.1 3.8  CL 103 103  CO2 31 31  GLUCOSE 118* 102*  BUN 15 13  CREATININE 0.52 0.52  CALCIUM 9.4 9.2   Liver Function Tests:  Recent Labs  05/10/13 2100 05/11/13 0456  AST 15 15  ALT 8 8  ALKPHOS 57 57  BILITOT 0.2* 0.3  PROT 5.8* 5.8*  ALBUMIN 2.9* 2.8*   No results found for this basename: LIPASE, AMYLASE,  in the last 72 hours No results found for this basename: AMMONIA,  in the last 72 hours CBC:  Recent Labs  05/10/13 2100 05/11/13 0456  WBC 11.8* 9.7  HGB 11.0* 11.0*  HCT 33.9* 34.1*  MCV 86.0 86.5  PLT 329 353   Cardiac Enzymes: No results found for this basename: CKTOTAL, CKMB, CKMBINDEX, TROPONINI,  in the last 72 hours BNP: No results found for this basename: PROBNP,  in the last 72 hours D-Dimer: No results found for this basename: DDIMER,  in the last 72 hours CBG: No results found for this basename: GLUCAP,  in the last 72 hours Hemoglobin A1C: No results found for this basename: HGBA1C,  in the last 72  hours Fasting Lipid Panel: No results found for this basename: CHOL, HDL, LDLCALC, TRIG, CHOLHDL, LDLDIRECT,  in the last 72 hours Thyroid Function Tests: No results found for this basename: TSH, T4TOTAL, FREET4, T3FREE, THYROIDAB,  in the last 72 hours Anemia Panel: No results found for this basename: VITAMINB12, FOLATE, FERRITIN, TIBC, IRON, RETICCTPCT,  in the last 72 hours Coagulation: No results found for this basename: LABPROT, INR,  in the last 72 hours Urine Drug Screen: Drugs of Abuse  No results found for this basename: labopia, cocainscrnur, labbenz, amphetmu, thcu, labbarb    Alcohol Level: No results found for this basename: ETH,  in the last 72 hours Urinalysis: No results  found for this basename: COLORURINE, APPERANCEUR, LABSPEC, PHURINE, GLUCOSEU, HGBUR, BILIRUBINUR, KETONESUR, PROTEINUR, UROBILINOGEN, NITRITE, LEUKOCYTESUR,  in the last 72 hours Misc. Labs:   ABGS: No results found for this basename: PHART, PCO2, PO2ART, TCO2, HCO3,  in the last 72 hours   MICROBIOLOGY: Recent Results (from the past 240 hour(s))  CULTURE, BLOOD (ROUTINE X 2)     Status: None   Collection Time    05/08/13  7:17 PM      Result Value Ref Range Status   Specimen Description LEFT ANTECUBITAL   Final   Special Requests BOTTLES DRAWN AEROBIC AND ANAEROBIC 10CC   Final   Culture NO GROWTH 3 DAYS   Final   Report Status PENDING   Incomplete  CULTURE, BLOOD (ROUTINE X 2)     Status: None   Collection Time    05/08/13  7:20 PM      Result Value Ref Range Status   Specimen Description RIGHT ANTECUBITAL   Final   Special Requests BOTTLES DRAWN AEROBIC AND ANAEROBIC 10CC   Final   Culture NO GROWTH 3 DAYS   Final   Report Status PENDING   Incomplete    Studies/Results: No results found.  Medications:  Prior to Admission:  Prescriptions prior to admission  Medication Sig Dispense Refill  . albuterol (PROVENTIL HFA;VENTOLIN HFA) 108 (90 BASE) MCG/ACT inhaler Inhale 2 puffs into the lungs every 4 (four) hours as needed for wheezing or shortness of breath.  1 Inhaler  0  . albuterol (PROVENTIL) (2.5 MG/3ML) 0.083% nebulizer solution Take 2.5 mg by nebulization every 6 (six) hours as needed for wheezing or shortness of breath.      . cefUROXime (CEFTIN) 500 MG tablet Take 1 tablet (500 mg total) by mouth 2 (two) times daily with a meal.  20 tablet  0  . cholecalciferol (VITAMIN D) 1000 UNITS tablet Take 1,000 Units by mouth daily.        Marland Kitchen dexamethasone (DECADRON) 2 MG tablet Take 1-2 tablets (2-4 mg total) by mouth daily. Take 4mg  for 2days then 2mg  for 2days then STOP  6 tablet  0  . ketorolac (ACULAR) 0.4 % SOLN Place 1 drop into both eyes 4 (four) times daily.      Marland Kitchen  levofloxacin (LEVAQUIN) 500 MG tablet Take 1 tablet (500 mg total) by mouth daily. X 7 days  10 tablet  0  . methimazole (TAPAZOLE) 5 MG tablet Take 2.5 mg by mouth every other day.       Marland Kitchen omeprazole (PRILOSEC) 20 MG capsule Take 1 capsule by mouth daily.      . OXYGEN-HELIUM IN Inhale 2 L into the lungs continuous.      . Simethicone (GAS RELIEF PO) Take 1-2 tablets by mouth daily as needed (gas relief).  Scheduled: . enoxaparin (LOVENOX) injection  30 mg Subcutaneous Q24H  . feeding supplement (PRO-STAT SUGAR FREE 64)  30 mL Oral TID WC  . ketorolac  1 drop Both Eyes QID  . levofloxacin (LEVAQUIN) IV  750 mg Intravenous Q48H  . methimazole  2.5 mg Oral QODAY  . mometasone-formoterol  2 puff Inhalation BID  . predniSONE  50 mg Oral Q breakfast  . sodium chloride  3 mL Intravenous Q12H  . tiotropium  18 mcg Inhalation Daily   Continuous: . sodium chloride 75 mL/hr at 05/12/13 0334   ZOX:WRUEAVPRN:sodium chloride, acetaminophen, acetaminophen, albuterol, ALPRAZolam, simethicone, sodium chloride  Assesment: She has pneumonia, COPD exacerbation and acute on chronic respiratory failure. She is very anxious I think that impacts her care. She has protein calorie malnutrition which obviously impacts her care Active Problems:   Respiratory failure, acute and chronic   Protein-calorie malnutrition, severe   COPD (chronic obstructive pulmonary disease)   Pneumonia    Plan: Continue current treatments. I discussed her with the hospitalist attending try her on something for anxiety to see if it would help    LOS: 2 days   Fredirick MaudlinEdward L Thelton Graca 05/12/2013, 8:58 AM

## 2013-05-12 NOTE — Clinical Documentation Improvement (Signed)
   Diagnosis of "Acute on Chronic Respiratory Failure" documented this admission.  Known use of chronic Home 02  (liters ppm not documented)  02 at 2 - 3 liters this admission  Respiratory Rate 18-22 this admission  02 sat 95 to 100% this admission    Please clarify if the diagnosis of "Acute on Chronic Respiratory Failure" is applicable to this admission.  If the diagnosis is not applicable for this admission, please provide an alternative condition.    Thank You,  Jerral Ralphathy R Veola Cafaro ,RN BSN CCDS Certified Clinical Documentation Specialist:  [phone] [facility] Information [facility]

## 2013-05-12 NOTE — Progress Notes (Signed)
ANTIBIOTIC CONSULT NOTE -   Pharmacy Consult for Levaquin Indication: pneumonia  Allergies  Allergen Reactions  . Promist La [Pseudoephedrine-Guaifenesin] Shortness Of Breath  . Sulfa Antibiotics Nausea And Vomiting  . Aspirin Other (See Comments)    "FELT AS IF SHE WOULD DIE"  . Iodine Other (See Comments)    FELT "BAD" EXAUSTED   . Latex Itching    PLASTIC BANDAIDS  . Prednisone Palpitations    SPEEDS HEART RATE   Patient Measurements: Height: 5\' 2"  (157.5 cm) Weight: 81 lb 8 oz (36.968 kg) IBW/kg (Calculated) : 50.1  Vital Signs: Temp: 97.9 F (36.6 C) (04/10 0546) Temp src: Oral (04/10 0546) BP: 107/69 mmHg (04/10 0546) Pulse Rate: 80 (04/10 0546) Intake/Output from previous day: 04/09 0701 - 04/10 0700 In: 2460 [P.O.:600; I.V.:1860] Out: 700 [Urine:700] Intake/Output from this shift: Total I/O In: 240 [P.O.:240] Out: -   Labs:  Recent Labs  05/10/13 2100 05/11/13 0456  WBC 11.8* 9.7  HGB 11.0* 11.0*  PLT 329 353  CREATININE 0.52 0.52   Estimated Creatinine Clearance: 35.5 ml/min (by C-G formula based on Cr of 0.52). No results found for this basename: VANCOTROUGH, VANCOPEAK, VANCORANDOM, GENTTROUGH, GENTPEAK, GENTRANDOM, TOBRATROUGH, TOBRAPEAK, TOBRARND, AMIKACINPEAK, AMIKACINTROU, AMIKACIN,  in the last 72 hours   Microbiology: Recent Results (from the past 720 hour(s))  CULTURE, BLOOD (ROUTINE X 2)     Status: None   Collection Time    05/08/13  7:17 PM      Result Value Ref Range Status   Specimen Description LEFT ANTECUBITAL   Final   Special Requests BOTTLES DRAWN AEROBIC AND ANAEROBIC 10CC   Final   Culture NO GROWTH 3 DAYS   Final   Report Status PENDING   Incomplete  CULTURE, BLOOD (ROUTINE X 2)     Status: None   Collection Time    05/08/13  7:20 PM      Result Value Ref Range Status   Specimen Description RIGHT ANTECUBITAL   Final   Special Requests BOTTLES DRAWN AEROBIC AND ANAEROBIC 10CC   Final   Culture NO GROWTH 3 DAYS   Final   Report Status PENDING   Incomplete   Medical History: Past Medical History  Diagnosis Date  . Arthritis   . Asthma   . COPD (chronic obstructive pulmonary disease)   . Thyroid disorder 10/31/2010   Medications:  Scheduled:  . enoxaparin (LOVENOX) injection  30 mg Subcutaneous Q24H  . feeding supplement (PRO-STAT SUGAR FREE 64)  30 mL Oral TID WC  . ketorolac  1 drop Both Eyes QID  . levofloxacin  750 mg Oral q1800  . methimazole  2.5 mg Oral QODAY  . mometasone-formoterol  2 puff Inhalation BID  . predniSONE  50 mg Oral Q breakfast  . sodium chloride  3 mL Intravenous Q12H  . tiotropium  18 mcg Inhalation Daily   Assessment: 78 yo F with CAP. She was seen in the ED on 4/6 and discharged with Ceftin bid & Levaquin 500mg  po daily.  She reported started taking these but due to worsening shortness of breath, she was referred for admission.  Renal function is at patient's baseline.  WBC improved. Estimated Normalized CrCl is > 6450ml/min.  SCr = 0.52 Pt is clinically improved per MD, Afebrile.  Levaquin 4/7>>  Goal of Therapy:  Eradicate infection.  Plan:  Switch to PO:  Levaquin 750mg  PO q24hrs (Levaquin po 100% BA)   Monitor renal function and cx data   Gabriel Paulding A  [place]eland 05/12/2013,11:05 AM

## 2013-05-13 DIAGNOSIS — R197 Diarrhea, unspecified: Secondary | ICD-10-CM

## 2013-05-13 LAB — CULTURE, BLOOD (ROUTINE X 2)
Culture: NO GROWTH
Culture: NO GROWTH

## 2013-05-13 MED ORDER — MAGIC MOUTHWASH
10.0000 mL | Freq: Four times a day (QID) | ORAL | Status: DC
  Administered 2013-05-13 – 2013-05-15 (×9): 10 mL via ORAL
  Filled 2013-05-13 (×9): qty 10

## 2013-05-13 NOTE — Progress Notes (Addendum)
[facility] PROGRESS NOTE  Gena Frayellie M More ZOX:[id number] DOB: 08/31/1935 DOA: 05/10/2013 PCP: Catalina PizzaHALL, ZACH, MD  Brief narrative: 78 year old female with a past medical history of asthma, COPD, thyroid disorder who presented to [facility] 05/10/2013 with worsening shortness of breath for past few days prior to this admission. Pt was admitted for COPD exacerbation and pneumonia treatment.   Assessment/Plan:  Principal Problem: Acute on chronic respiratory failure, COPD exaacerbation  - respiratory status stable this am; appreciate pulmonary following  - continue scheduled albuterol every 4 hours, dulera inhaler and spiriva - daily steroid, prednisone 50 mg daily - continue Levaquin for possible pneumoia - blood cultures to date show no growth - obtain PT evaluation Active Problems: Community acquired pneumonia:  - Continue on levofloxacin, started on 4.8.2015  - blood culture to date shows no growth   - legionella and strep pneumo are negative  Hyperthyroidism.  - Continue methimazole  Diarrhea - check stool for C.diff Severe protein calorie malnutrition - nutrition consulted   Code Status: full code  Family Communication: no family at the bedside Disposition Plan: home when stable   Consultants:  None  Procedures:  None  Antibiotics:  Levaquin [id number]  Alison MurrayAlma M Matti Killingsworth, MD  [facility] Pager [phone]  If 7PM-7AM, please contact night-coverage [email or web address] Password [email or web address] 05/13/2013, 11:01 AM   LOS: 3 days    HPI/Subjective: Says she still has diarrhea and feels weak this am.   Objective: Filed Vitals:   05/13/13 0125 05/13/13 0351 05/13/13 0718 05/13/13 0729  BP:   127/84   Pulse:   100   Temp:   97.6 F (36.4 C)   TempSrc:   Oral   Resp:   18   Height:      Weight:      SpO2: 97% 98% 92% 100%    Intake/Output Summary (Last 24 hours) at 05/13/13 1101 Last data filed at 05/13/13 0700  Gross per 24 hour  Intake    100 ml  Output      0 ml  Net    100  ml    Exam:   General:  Pt is alert, follows commands appropriately, not in acute distress  Cardiovascular: Regular rate and rhythm, S1/S2, no murmurs, no rubs, no gallops  Respiratory: coarse breath sounds bilaterally  Abdomen: Soft, non tender, non distended, bowel sounds present, no guarding  Extremities: No edema, pulses DP and PT palpable bilaterally  Neuro: Grossly nonfocal  Data Reviewed: Basic Metabolic Panel:  Recent Labs Lab 05/08/13 1636 05/10/13 2100 05/11/13 0456  NA 146 143 143  K 4.0 4.1 3.8  CL 104 103 103  CO2 36* 31 31  GLUCOSE 124* 118* 102*  BUN 14 15 13   CREATININE 0.76 0.52 0.52  CALCIUM 9.5 9.4 9.2   Liver Function Tests:  Recent Labs Lab 05/10/13 2100 05/11/13 0456  AST 15 15  ALT 8 8  ALKPHOS 57 57  BILITOT 0.2* 0.3  PROT 5.8* 5.8*  ALBUMIN 2.9* 2.8*   No results found for this basename: LIPASE, AMYLASE,  in the last 168 hours No results found for this basename: AMMONIA,  in the last 168 hours CBC:  Recent Labs Lab 05/08/13 1636 05/10/13 2100 05/11/13 0456  WBC 11.1* 11.8* 9.7  NEUTROABS 8.4*  --   --   HGB 12.5 11.0* 11.0*  HCT 40.0 33.9* 34.1*  MCV 88.9 86.0 86.5  PLT 314 329 353   Cardiac Enzymes: No results found for this basename: CKTOTAL, CKMB, CKMBINDEX, TROPONINI,  in the last 168 hours BNP: No components found with this basename: POCBNP,  CBG: No results found for this basename: GLUCAP,  in the last 168 hours  CULTURE, BLOOD (ROUTINE X 2)     Status: None   Collection Time    05/08/13  7:17 PM      Result Value Ref Range Status   Specimen Description LEFT ANTECUBITAL   Final   Culture NO GROWTH 5 DAYS   Final   Report Status 05/13/2013 FINAL   Final  CULTURE, BLOOD (ROUTINE X 2)     Status: None   Collection Time    05/08/13  7:20 PM      Result Value Ref Range Status   Specimen Description RIGHT ANTECUBITAL   Final   Culture NO GROWTH 5 DAYS   Final   Report Status 05/13/2013 FINAL   Final      Studies: No results found.  Scheduled Meds: . albuterol  2.5 mg Nebulization Q4H WA  . enoxaparin (LOVENOX)   30 mg Subcutaneous Q24H  . feeding supplement   30 mL Oral TID WC  . ketorolac  1 drop Both Eyes QID  . levofloxacin  750 mg Oral q1800  . magic mouthwash  10 mL Oral QID  . methimazole  2.5 mg Oral QODAY  . mometasone-formoterol  2 puff Inhalation BID  . predniSONE  50 mg Oral Q breakfast  . tiotropium  18 mcg Inhalation Daily   Continuous Infusions: . sodium chloride 75 mL/hr at 05/12/13 0334

## 2013-05-13 NOTE — Evaluation (Signed)
Physical Therapy Evaluation Patient Details Name: [name] Marksberry MRN: [id number] DOB: 12/19/1935 Today's Date: 05/13/2013   History of Present Illness  Patient is a 78 y/o Female who presests to hospital with chief c/o acute on chronic respiratory failure secondary to community acquired pneumonia, hypothyroidism, diarrhea and severe protein caloric malnutrition resulting in significant bilateral LE weakness.   Clinical Impression  Patient initially refused to get out of bed upon arrival. During evaluation patient displays poor UE and LE strength (MMT 3/5) resulting in limited ability to perform physical exercise. Patient did perform sit to stand transfer with Min Assist due to loss of balance secondary to generalized weakness. Patient performed several bed exercise but declined to perform multiple repetitions secondary to fatigue despite max verbal cuing. Patient states she feels really weak due to her difficulty breathing and heart racing secondary to diarrhea. Therapists expects patient's strength to improve enough for patient to be appropriate for SNF placement upon discharge due to limited home support and nee for constant care and skilled therapy secondary to patient limited activity tolerance, falls risk and generalized weakness. PT will continue to see patient during current hospital stay.     Follow Up Recommendations SNF    Equipment Recommendations  None recommended by PT    Recommendations for Other Services       Precautions / Restrictions Precautions Precautions: Fall Precaution Comments: high risk of fallls secondary to generalized wekness Restrictions Weight Bearing Restrictions: No      Mobility  Bed Mobility Overal bed mobility: Modified Independent                Transfers Overall transfer level: Needs assistance Equipment used: Rolling walker (2 wheeled) Transfers: Sit to/from Stand Sit to Stand: Min assist         General transfer comment: asssitance  for safety secondary to weakness and loss of balance.   Ambulation/Gait Ambulation/Gait assistance:  (unable to test tdue to patient refusal secondary to fatigue. )              Stairs            Wheelchair Mobility    Modified Rankin (Stroke Patients Only)       Balance Overall balance assessment: Needs assistance Sitting-balance support: Single extremity supported Sitting balance-Leahy Scale: Poor     Standing balance support: Bilateral upper extremity supported Standing balance-Leahy Scale: Poor                               Pertinent Vitals/Pain No pain noted.     Home Living Family/patient expects to be discharged to:: Private residence Living Arrangements: Alone Available Help at Discharge: Family;Available PRN/intermittently Type of Home: House Home Access: Stairs to enter Entrance Stairs-Rails: Doctor, general practiceight;Left Entrance Stairs-Number of Steps: 1 Home Layout: Able to live on main level with bedroom/bathroom Home Equipment: Walker - 2 wheels;Wheelchair - manual;Cane - single point      Prior Function Level of Independence: Independent with assistive device(s)         Comments: Pt is independent with ADLs and some IADLs - her neice assist with IADLs - laundry, shopping, getting the mail - as needed     Hand Dominance   Dominant Hand: Right    Extremity/Trunk Assessment               Lower Extremity Assessment: Generalized weakness (no apparent sensation or coordinatioj difficulties, MMT  3/5 with all UE and  LE muscles tested. )      Cervical / Trunk Assessment: Kyphotic  Communication   Communication: No difficulties  Cognition Arousal/Alertness: Awake/alert Behavior During Therapy: WFL for tasks assessed/performed Overall Cognitive Status: Within Functional Limits for tasks assessed                      General Comments      Exercises General Exercises - Upper Extremity Chair Push Up:  AROM;Strengthening;Both;5 reps General Exercises - Lower Extremity Ankle Circles/Pumps: AROM;Both;10 reps Gluteal Sets: AROM;Both;5 reps Heel Slides: AROM;Both;5 reps      Assessment/Plan    PT Assessment Patient needs continued PT services  PT Diagnosis Difficulty walking;Generalized weakness   PT Problem List Decreased strength;Decreased activity tolerance;Decreased mobility;Cardiopulmonary status limiting activity;Decreased balance  PT Treatment Interventions Gait training;Functional mobility training;Therapeutic exercise;Patient/family education   PT Goals (Current goals can be found in the Care Plan section) Acute Rehab PT Goals Patient Stated Goal: to get stronger so she can walk again without fear of falling PT Goal Formulation: With patient Time For Goal Achievement: 05/20/13 Potential to Achieve Goals: Fair    Frequency Min 3X/week   Barriers to discharge Decreased caregiver support pt should have 24 hour support at this time    Co-evaluation               End of Session   Activity Tolerance: Patient limited by fatigue Patient left: in bed;with call bell/phone within reach;with bed alarm set           Time: 0245-0305 PT Time Calculation (min): 20 min   Charges:   PT Evaluation $Initial PT Evaluation Tier I: 1 Procedure PT Treatments $Therapeutic Exercise: 8-22 mins   PT G Codes:          Rodolph Hagemann R Heber Hoog PT DPT 05/13/2013, 3:14 PM

## 2013-05-13 NOTE — Progress Notes (Signed)
Subjective: She says she feels terrible today. She is more short of breath she's having a lot of discomfort when she swallows and is having diarrhea.  Objective: Vital signs in last 24 hours: Temp:  [97.5 F (36.4 C)-97.6 F (36.4 C)] 97.6 F (36.4 C) (04/11 0718) Pulse Rate:  [82-100] 100 (04/11 0718) Resp:  [16-18] 18 (04/11 0718) BP: (105-127)/(70-84) 127/84 mmHg (04/11 0718) SpO2:  [92 %-100 %] 100 % (04/11 0729) Weight change:  Last BM Date: 05/13/13  Intake/Output from previous day: 04/10 0701 - 04/11 0700 In: 340 [P.O.:340] Out: -   PHYSICAL EXAM General appearance: alert, cooperative, moderate distress and very thin Resp: rhonchi bilaterally Cardio: regular rate and rhythm, S1, S2 normal, no murmur, click, rub or gallop GI: soft, non-tender; bowel sounds normal; no masses,  no organomegaly Extremities: extremities normal, atraumatic, no cyanosis or edema  Lab Results:    Basic Metabolic Panel:  Recent Labs  91/47/82 2100 05/11/13 0456  NA 143 143  K 4.1 3.8  CL 103 103  CO2 31 31  GLUCOSE 118* 102*  BUN 15 13  CREATININE 0.52 0.52  CALCIUM 9.4 9.2   Liver Function Tests:  Recent Labs  05/10/13 2100 05/11/13 0456  AST 15 15  ALT 8 8  ALKPHOS 57 57  BILITOT 0.2* 0.3  PROT 5.8* 5.8*  ALBUMIN 2.9* 2.8*   No results found for this basename: LIPASE, AMYLASE,  in the last 72 hours No results found for this basename: AMMONIA,  in the last 72 hours CBC:  Recent Labs  05/10/13 2100 05/11/13 0456  WBC 11.8* 9.7  HGB 11.0* 11.0*  HCT 33.9* 34.1*  MCV 86.0 86.5  PLT 329 353   Cardiac Enzymes: No results found for this basename: CKTOTAL, CKMB, CKMBINDEX, TROPONINI,  in the last 72 hours BNP: No results found for this basename: PROBNP,  in the last 72 hours D-Dimer: No results found for this basename: DDIMER,  in the last 72 hours CBG: No results found for this basename: GLUCAP,  in the last 72 hours Hemoglobin A1C: No results found for this  basename: HGBA1C,  in the last 72 hours Fasting Lipid Panel: No results found for this basename: CHOL, HDL, LDLCALC, TRIG, CHOLHDL, LDLDIRECT,  in the last 72 hours Thyroid Function Tests: No results found for this basename: TSH, T4TOTAL, FREET4, T3FREE, THYROIDAB,  in the last 72 hours Anemia Panel: No results found for this basename: VITAMINB12, FOLATE, FERRITIN, TIBC, IRON, RETICCTPCT,  in the last 72 hours Coagulation: No results found for this basename: LABPROT, INR,  in the last 72 hours Urine Drug Screen: Drugs of Abuse  No results found for this basename: labopia, cocainscrnur, labbenz, amphetmu, thcu, labbarb    Alcohol Level: No results found for this basename: ETH,  in the last 72 hours Urinalysis: No results found for this basename: COLORURINE, APPERANCEUR, LABSPEC, PHURINE, GLUCOSEU, HGBUR, BILIRUBINUR, KETONESUR, PROTEINUR, UROBILINOGEN, NITRITE, LEUKOCYTESUR,  in the last 72 hours Misc. Labs:  ABGS No results found for this basename: PHART, PCO2, PO2ART, TCO2, HCO3,  in the last 72 hours CULTURES Recent Results (from the past 240 hour(s))  CULTURE, BLOOD (ROUTINE X 2)     Status: None   Collection Time    05/08/13  7:17 PM      Result Value Ref Range Status   Specimen Description LEFT ANTECUBITAL   Final   Special Requests BOTTLES DRAWN AEROBIC AND ANAEROBIC 10CC   Final   Culture NO GROWTH 5 DAYS  Final   Report Status 05/13/2013 FINAL   Final  CULTURE, BLOOD (ROUTINE X 2)     Status: None   Collection Time    05/08/13  7:20 PM      Result Value Ref Range Status   Specimen Description RIGHT ANTECUBITAL   Final   Special Requests BOTTLES DRAWN AEROBIC AND ANAEROBIC 10CC   Final   Culture NO GROWTH 5 DAYS   Final   Report Status 05/13/2013 FINAL   Final   Studies/Results: No results found.  Medications:  Prior to Admission:  Prescriptions prior to admission  Medication Sig Dispense Refill  . albuterol (PROVENTIL HFA;VENTOLIN HFA) 108 (90 BASE) MCG/ACT  inhaler Inhale 2 puffs into the lungs every 4 (four) hours as needed for wheezing or shortness of breath.  1 Inhaler  0  . albuterol (PROVENTIL) (2.5 MG/3ML) 0.083% nebulizer solution Take 2.5 mg by nebulization every 6 (six) hours as needed for wheezing or shortness of breath.      . cefUROXime (CEFTIN) 500 MG tablet Take 1 tablet (500 mg total) by mouth 2 (two) times daily with a meal.  20 tablet  0  . cholecalciferol (VITAMIN D) 1000 UNITS tablet Take 1,000 Units by mouth daily.        Marland Kitchen. dexamethasone (DECADRON) 2 MG tablet Take 1-2 tablets (2-4 mg total) by mouth daily. Take 4mg  for 2days then 2mg  for 2days then STOP  6 tablet  0  . ketorolac (ACULAR) 0.4 % SOLN Place 1 drop into both eyes 4 (four) times daily.      Marland Kitchen. levofloxacin (LEVAQUIN) 500 MG tablet Take 1 tablet (500 mg total) by mouth daily. X 7 days  10 tablet  0  . methimazole (TAPAZOLE) 5 MG tablet Take 2.5 mg by mouth every other day.       Marland Kitchen. omeprazole (PRILOSEC) 20 MG capsule Take 1 capsule by mouth daily.      . OXYGEN-HELIUM IN Inhale 2 L into the lungs continuous.      . Simethicone (GAS RELIEF PO) Take 1-2 tablets by mouth daily as needed (gas relief).        Scheduled: . albuterol  2.5 mg Nebulization Q4H WA  . enoxaparin (LOVENOX) injection  30 mg Subcutaneous Q24H  . feeding supplement (PRO-STAT SUGAR FREE 64)  30 mL Oral TID WC  . ketorolac  1 drop Both Eyes QID  . levofloxacin  750 mg Oral q1800  . magic mouthwash  10 mL Oral QID  . methimazole  2.5 mg Oral QODAY  . mometasone-formoterol  2 puff Inhalation BID  . predniSONE  50 mg Oral Q breakfast  . sodium chloride  3 mL Intravenous Q12H  . tiotropium  18 mcg Inhalation Daily   Continuous: . sodium chloride 75 mL/hr at 05/12/13 0334   ZOX:WRUEAVPRN:sodium chloride, acetaminophen, acetaminophen, albuterol, ALPRAZolam, ondansetron (ZOFRAN) IV, simethicone, sodium chloride  Assesment: She was admitted with COPD exacerbation and pneumonia. She has acute on chronic  respiratory failure. She's complaining of diarrhea today and has evidence of thrush in the back of her throat. Active Problems:   Respiratory failure, acute and chronic   Protein-calorie malnutrition, severe   COPD (chronic obstructive pulmonary disease)   Pneumonia    Plan: I have gone ahead and taken the liberty of ordering a C. difficile evaluation and gave her Magic mouthwash    LOS: 3 days   Fredirick Maudlindward L Bryanne Riquelme 05/13/2013, 10:37 AM

## 2013-05-13 NOTE — Progress Notes (Signed)
BRIEF NUTRITION FOLLOW UP NOTE   Received MD consult to assess nutrition status. Pt recently by RD during this admission (05/11/13), due to low BMI. At that visit, RD diagnosed pt with severe malnutrition in the context of chronic illness due to hx of decreased PO intake and severe muscle depletion as evidenced by nutrition focused physical exam. Pt was also started on nutritional supplement of 30 ml Prostat TID, a regimen which provides 300 kcals and 45 grams protein.  Chart reviewed. Appetite is variable at baseline (PO: 20-75%), however, intake has improved with this hospitalization per documented meal intake (PO: 50-75%). She remains on a regular diet. No new weight readings since last visit, so unable to assess weight changes at time and estimated nutrition needs remain the same. Pt MD notes, pt will be tested for C-Diff. Magic Mouthwash has been ordered due to sore throat. [facility]charge disposition [facility]  Will continue to follow and assess effectiveness of nutrition interventions.   Fallynn Gravett A. Mayford KnifeWilliams, RD, LDN Pager: [phone]

## 2013-05-14 LAB — CLOSTRIDIUM DIFFICILE BY PCR: CDIFFPCR: NEGATIVE

## 2013-05-14 NOTE — Progress Notes (Signed)
[facility] PROGRESS NOTE  [name] [id number] DOB: 12/08/1935 DOA: 05/10/2013 PCP: Catalina PizzaHALL, ZACH, MD  Brief narrative: 78 year old female with a past medical history of asthma, COPD, thyroid disorder who presented to [facility] 05/10/2013 with worsening shortness of breath for past few days prior to this admission. Pt was admitted for COPD exacerbation and pneumonia treatment.   Assessment/Plan:  Principal Problem: Acute on chronic respiratory failure, COPD exaacerbation  - respiratory status stable this am; appreciate pulmonary following  - continue scheduled albuterol every 4 hours, dulera inhaler and spiriva - daily steroid, prednisone 50 mg daily - continue Levaquin for possible pneumoia - blood cultures to date show no growth - PT evaluation recommends SNF Active Problems: Community acquired pneumonia:  - Continue on levofloxacin, started on 4.8.2015  - blood culture to date shows no growth   - legionella and strep pneumo are negative  Hyperthyroidism.  - Continue methimazole  Diarrhea - C diff negative Severe protein calorie malnutrition - nutrition consulted   Code Status: full code  Family Communication: no family at the bedside Disposition Plan: home when stable   Consultants:  None  Procedures:  None  Antibiotics:  Levaquin 4.8.2015  Erick BlinksJehanzeb Marguerette Sheller, MD  [facility] Pager [phone]  If 7PM-7AM, please contact night-coverage [email or web address] Password [email or web address] 05/14/2013, 8:45 PM   LOS: 4 days    HPI/Subjective: Saying she has trouble expectorating sputum  Objective: Filed Vitals:   05/14/13 1223 05/14/13 1500 05/14/13 1557 05/14/13 2041  BP:  128/63    Pulse:  96    Temp:  97.9 F (36.6 C)    TempSrc:  Oral    Resp:  20    Height:      Weight:      SpO2: 90% 98% 98% 98%    Intake/Output Summary (Last 24 hours) at 05/14/13 2045 Last data filed at 05/14/13 1557  Gross per 24 hour  Intake    700 ml  Output      0 ml  Net    700 ml     Exam:   General:  Pt is alert, follows commands appropriately, not in acute distress  Cardiovascular: Regular rate and rhythm, S1/S2, no murmurs, no rubs, no gallops  Respiratory: diminished breath sounds bilaterally  Abdomen: Soft, non tender, non distended, bowel sounds present, no guarding  Extremities: No edema, pulses DP and PT palpable bilaterally  Neuro: Grossly nonfocal  Data Reviewed: Basic Metabolic Panel:  Recent Labs Lab 05/08/13 1636 05/10/13 2100 05/11/13 0456  NA 146 143 143  K 4.0 4.1 3.8  CL 104 103 103  CO2 36* 31 31  GLUCOSE 124* 118* 102*  BUN 14 15 13   CREATININE 0.76 0.52 0.52  CALCIUM 9.5 9.4 9.2   Liver Function Tests:  Recent Labs Lab 05/10/13 2100 05/11/13 0456  AST 15 15  ALT 8 8  ALKPHOS 57 57  BILITOT 0.2* 0.3  PROT 5.8* 5.8*  ALBUMIN 2.9* 2.8*   No results found for this basename: LIPASE, AMYLASE,  in the last 168 hours No results found for this basename: AMMONIA,  in the last 168 hours CBC:  Recent Labs Lab 05/08/13 1636 05/10/13 2100 05/11/13 0456  WBC 11.1* 11.8* 9.7  NEUTROABS 8.4*  --   --   HGB 12.5 11.0* 11.0*  HCT 40.0 33.9* 34.1*  MCV 88.9 86.0 86.5  PLT 314 329 353   Cardiac Enzymes: No results found for this basename: CKTOTAL, CKMB, CKMBINDEX, TROPONINI,  in the last 168 hours  BNP: No components found with this basename: POCBNP,  CBG: No results found for this basename: GLUCAP,  in the last 168 hours  CULTURE, BLOOD (ROUTINE X 2)     Status: None   Collection Time    05/08/13  7:17 PM      Result Value Ref Range Status   Specimen Description LEFT ANTECUBITAL   Final   Culture NO GROWTH 5 DAYS   Final   Report Status 05/13/2013 FINAL   Final  CULTURE, BLOOD (ROUTINE X 2)     Status: None   Collection Time    05/08/13  7:20 PM      Result Value Ref Range Status   Specimen Description RIGHT ANTECUBITAL   Final   Culture NO GROWTH 5 DAYS   Final   Report Status 05/13/2013 FINAL   Final      Studies: No results found.  Scheduled Meds: . albuterol  2.5 mg Nebulization Q4H WA  . enoxaparin (LOVENOX)   30 mg Subcutaneous Q24H  . feeding supplement   30 mL Oral TID WC  . ketorolac  1 drop Both Eyes QID  . levofloxacin  750 mg Oral q1800  . magic mouthwash  10 mL Oral QID  . methimazole  2.5 mg Oral QODAY  . mometasone-formoterol  2 puff Inhalation BID  . predniSONE  50 mg Oral Q breakfast  . tiotropium  18 mcg Inhalation Daily   Continuous Infusions: . sodium chloride 75 mL/hr (05/14/13 1022)

## 2013-05-14 NOTE — Progress Notes (Signed)
Subjective: She says she feels better. She has no new complaints. She is negative for C. difficile. She says she still having some trouble swallowing  Objective: Vital signs in last 24 hours: Temp:  [97.7 F (36.5 C)-98.6 F (37 C)] 97.7 F (36.5 C) (04/12 0718) Pulse Rate:  [86-89] 89 (04/12 0718) Resp:  [18] 18 (04/12 0718) BP: (97-114)/(60-75) 114/75 mmHg (04/12 0718) SpO2:  [95 %-100 %] 97 % (04/12 0739) Weight change:  Last BM Date: 05/13/13  Intake/Output from previous day:    PHYSICAL EXAM General appearance: alert, cooperative, mild distress and Very thin Resp: clear to auscultation bilaterally Cardio: regular rate and rhythm, S1, S2 normal, no murmur, click, rub or gallop GI: soft, non-tender; bowel sounds normal; no masses,  no organomegaly Extremities: extremities normal, atraumatic, no cyanosis or edema  Lab Results:    Basic Metabolic Panel: No results found for this basename: NA, K, CL, CO2, GLUCOSE, BUN, CREATININE, CALCIUM, MG, PHOS,  in the last 72 hours Liver Function Tests: No results found for this basename: AST, ALT, ALKPHOS, BILITOT, PROT, ALBUMIN,  in the last 72 hours No results found for this basename: LIPASE, AMYLASE,  in the last 72 hours No results found for this basename: AMMONIA,  in the last 72 hours CBC: No results found for this basename: WBC, NEUTROABS, HGB, HCT, MCV, PLT,  in the last 72 hours Cardiac Enzymes: No results found for this basename: CKTOTAL, CKMB, CKMBINDEX, TROPONINI,  in the last 72 hours BNP: No results found for this basename: PROBNP,  in the last 72 hours D-Dimer: No results found for this basename: DDIMER,  in the last 72 hours CBG: No results found for this basename: GLUCAP,  in the last 72 hours Hemoglobin A1C: No results found for this basename: HGBA1C,  in the last 72 hours Fasting Lipid Panel: No results found for this basename: CHOL, HDL, LDLCALC, TRIG, CHOLHDL, LDLDIRECT,  in the last 72 hours Thyroid  Function Tests: No results found for this basename: TSH, T4TOTAL, FREET4, T3FREE, THYROIDAB,  in the last 72 hours Anemia Panel: No results found for this basename: VITAMINB12, FOLATE, FERRITIN, TIBC, IRON, RETICCTPCT,  in the last 72 hours Coagulation: No results found for this basename: LABPROT, INR,  in the last 72 hours Urine Drug Screen: Drugs of Abuse  No results found for this basename: labopia, cocainscrnur, labbenz, amphetmu, thcu, labbarb    Alcohol Level: No results found for this basename: ETH,  in the last 72 hours Urinalysis: No results found for this basename: COLORURINE, APPERANCEUR, LABSPEC, PHURINE, GLUCOSEU, HGBUR, BILIRUBINUR, KETONESUR, PROTEINUR, UROBILINOGEN, NITRITE, LEUKOCYTESUR,  in the last 72 hours Misc. Labs:  ABGS No results found for this basename: PHART, PCO2, PO2ART, TCO2, HCO3,  in the last 72 hours CULTURES Recent Results (from the past 240 hour(s))  CULTURE, BLOOD (ROUTINE X 2)     Status: None   Collection Time    05/08/13  7:17 PM      Result Value Ref Range Status   Specimen Description LEFT ANTECUBITAL   Final   Special Requests BOTTLES DRAWN AEROBIC AND ANAEROBIC 10CC   Final   Culture NO GROWTH 5 DAYS   Final   Report Status 05/13/2013 FINAL   Final  CULTURE, BLOOD (ROUTINE X 2)     Status: None   Collection Time    05/08/13  7:20 PM      Result Value Ref Range Status   Specimen Description RIGHT ANTECUBITAL   Final   Special  Requests BOTTLES DRAWN AEROBIC AND ANAEROBIC 10CC   Final   Culture NO GROWTH 5 DAYS   Final   Report Status 05/13/2013 FINAL   Final  CLOSTRIDIUM DIFFICILE BY PCR     Status: None   Collection Time    05/14/13  6:08 AM      Result Value Ref Range Status   C difficile by pcr NEGATIVE  NEGATIVE Final   Studies/Results: No results found.  Medications:  Prior to Admission:  Prescriptions prior to admission  Medication Sig Dispense Refill  . albuterol (PROVENTIL HFA;VENTOLIN HFA) 108 (90 BASE) MCG/ACT inhaler  Inhale 2 puffs into the lungs every 4 (four) hours as needed for wheezing or shortness of breath.  1 Inhaler  0  . albuterol (PROVENTIL) (2.5 MG/3ML) 0.083% nebulizer solution Take 2.5 mg by nebulization every 6 (six) hours as needed for wheezing or shortness of breath.      . cefUROXime (CEFTIN) 500 MG tablet Take 1 tablet (500 mg total) by mouth 2 (two) times daily with a meal.  20 tablet  0  . cholecalciferol (VITAMIN D) 1000 UNITS tablet Take 1,000 Units by mouth daily.        Marland Kitchen dexamethasone (DECADRON) 2 MG tablet Take 1-2 tablets (2-4 mg total) by mouth daily. Take 4mg  for 2days then 2mg  for 2days then STOP  6 tablet  0  . ketorolac (ACULAR) 0.4 % SOLN Place 1 drop into both eyes 4 (four) times daily.      Marland Kitchen levofloxacin (LEVAQUIN) 500 MG tablet Take 1 tablet (500 mg total) by mouth daily. X 7 days  10 tablet  0  . methimazole (TAPAZOLE) 5 MG tablet Take 2.5 mg by mouth every other day.       Marland Kitchen omeprazole (PRILOSEC) 20 MG capsule Take 1 capsule by mouth daily.      . OXYGEN-HELIUM IN Inhale 2 L into the lungs continuous.      . Simethicone (GAS RELIEF PO) Take 1-2 tablets by mouth daily as needed (gas relief).        Scheduled: . albuterol  2.5 mg Nebulization Q4H WA  . enoxaparin (LOVENOX) injection  30 mg Subcutaneous Q24H  . feeding supplement (PRO-STAT SUGAR FREE 64)  30 mL Oral TID WC  . ketorolac  1 drop Both Eyes QID  . levofloxacin  750 mg Oral q1800  . magic mouthwash  10 mL Oral QID  . methimazole  2.5 mg Oral QODAY  . mometasone-formoterol  2 puff Inhalation BID  . predniSONE  50 mg Oral Q breakfast  . sodium chloride  3 mL Intravenous Q12H  . tiotropium  18 mcg Inhalation Daily   Continuous: . sodium chloride 75 mL/hr at 05/12/13 0334   WUJ:WJXBJY chloride, acetaminophen, acetaminophen, albuterol, ALPRAZolam, ondansetron (ZOFRAN) IV, simethicone, sodium chloride  Assesment: She was admitted with acute on chronic respiratory failure. This is improving. She has severe  COPD at baseline. She has pneumonia which seems to be improving.  She has protein calorie malnutrition and is taking some supplements but having difficulty  She has anxiety and that is better with treatment  She has significant deconditioning and weakness and it is recommended that she go to skilled care facility at discharge Active Problems:   Respiratory failure, acute and chronic   Protein-calorie malnutrition, severe   COPD (chronic obstructive pulmonary disease)   Pneumonia    Plan: No change in respiratory treatments.    LOS: 4 days   Fredirick Maudlin 05/14/2013,  10:01 AM

## 2013-05-15 MED ORDER — PREDNISONE 10 MG PO TABS: ORAL_TABLET | ORAL | Status: AC

## 2013-05-15 MED ORDER — MAGIC MOUTHWASH: 10.0000 mL | Freq: Four times a day (QID) | ORAL | Status: AC

## 2013-05-15 MED ORDER — MOMETASONE FURO-FORMOTEROL FUM 200-5 MCG/ACT IN AERO: 2.0000 | INHALATION_SPRAY | Freq: Two times a day (BID) | RESPIRATORY_TRACT | Status: AC

## 2013-05-15 MED ORDER — LEVOFLOXACIN 750 MG PO TABS: 750.0000 mg | ORAL_TABLET | Freq: Every day | ORAL | Status: AC

## 2013-05-15 NOTE — Clinical Social Work Psychosocial (Signed)
Clinical Social Work Department BRIEF PSYCHOSOCIAL ASSESSMENT 05/15/2013  Patient:  [name], [name]     Account Number:  [id number]     Admit date:  05/10/2013  Clinical Social Worker:  Wyatt Haste  Date/Time:  05/15/2013 09:27 AM  Referred by:  Physician  Date Referred:  05/15/2013 Referred for  SNF Placement   Other Referral:   Interview type:  Patient Other interview type:    PSYCHOSOCIAL DATA Living Status:  ALONE Admitted from facility:   Level of care:   Primary support name:  Sunday Spillers Primary support relationship to patient:  FAMILY Degree of support available:   supportive per pt    CURRENT CONCERNS Current Concerns  Post-Acute Placement   Other Concerns:    SOCIAL WORK ASSESSMENT / PLAN CSW met with pt at bedside. Pt alert and oriented and reports she lives alone. Her niece, Sunday Spillers is her best support. She lives the closest to her and checks on her often. Pt also has a son and other nieces who are involved. At baseline, pt is fairly independent. She ambulates using a walker. PT evaluated pt yesterday and recommendation is for SNF due to significant weakness. CSW discussed placement process. Pt's primary concern is where she would go. She is requesting [facility] as her niece works there. SNF list provided. CSW will fax out referral and follow up with bed offers when available.   Assessment/plan status:  Psychosocial Support/Ongoing Assessment of Needs Other assessment/ plan:   Information/referral to community resources:   SNF list    PATIENT'S/FAMILY'S RESPONSE TO PLAN OF CARE: Pt agreeable to SNF, but states it will be very [place], [place]

## 2013-05-15 NOTE — Clinical Social Work Placement (Signed)
Clinical Social Work Department CLINICAL SOCIAL WORK PLACEMENT NOTE 05/15/2013  Patient:  [name],[name]  Account Number:  [id number] Admit date:  05/10/2013  Clinical Social Worker:  Derenda FennelKARA Arlyss Weathersby, LCSW  Date/time:  05/15/2013 09:26 AM  Clinical Social Work is seeking post-discharge placement for this patient at the following level of care:   SKILLED NURSING   (*CSW will update this form in Epic as items are completed)   05/15/2013  Patient/family provided with [facility] of [facility] Work's list of facilities offering this level of care within the geographic area requested by the patient (or if unable, by the patient's family).  05/15/2013  Patient/family informed of their freedom to choose among providers that offer the needed level of care, that participate in Medicare, Medicaid or managed care program needed by the patient, have an available bed and are willing to accept the patient.  05/15/2013  Patient/family informed of [facility]' ownership interest in [facility], as well as of the fact that they are under no obligation to receive care at this facility.  PASARR submitted to EDS on  PASARR number received from EDS on   FL2 transmitted to all facilities in geographic area requested by pt/family on  05/15/2013 FL2 transmitted to all facilities within larger geographic area on   Patient informed that his/her managed care company has contracts with or will negotiate with  certain facilities, including the following:     Patient/family informed of bed offers received:  05/15/2013 [facility] Physician recommends and patient [facility] bed [facility]  [facility]  [facility] on  05/15/2013 Patient to be transferred to facility by family  The following physician request were entered in Epic:   Additional Comments: Pt has existing pasarr.  Derenda FennelKara Alianah Lofton, [place] [phone]

## 2013-05-15 NOTE — Progress Notes (Signed)
ANTIBIOTIC CONSULT NOTE -   Pharmacy Consult for Levaquin Indication: pneumonia  Allergies  Allergen Reactions  . Promist La [Pseudoephedrine-Guaifenesin] Shortness Of Breath  . Sulfa Antibiotics Nausea And Vomiting  . Aspirin Other (See Comments)    "FELT AS IF SHE WOULD DIE"  . Iodine Other (See Comments)    FELT "BAD" EXAUSTED   . Latex Itching    PLASTIC BANDAIDS  . Prednisone Palpitations    SPEEDS HEART RATE   Patient Measurements: Height: 5\' 2"  (157.5 cm) Weight: 81 lb 8 oz (36.968 kg) IBW/kg (Calculated) : 50.1  Vital Signs: Temp: 97.8 F (36.6 C) (04/13 0656) Temp src: Oral (04/13 0656) BP: 106/64 mmHg (04/13 0656) Pulse Rate: 82 (04/13 0656) Intake/Output from previous day: 04/12 0701 - 04/13 0700 In: 700 [I.V.:700] Out: -  Intake/Output from this shift:    Labs: No results found for this basename: WBC, HGB, PLT, LABCREA, CREATININE,  in the last 72 hours Estimated Creatinine Clearance: 35.5 ml/min (by C-G formula based on Cr of 0.52). No results found for this basename: VANCOTROUGH, VANCOPEAK, VANCORANDOM, GENTTROUGH, GENTPEAK, GENTRANDOM, TOBRATROUGH, TOBRAPEAK, TOBRARND, AMIKACINPEAK, AMIKACINTROU, AMIKACIN,  in the last 72 hours   Microbiology: Recent Results (from the past 720 hour(s))  CULTURE, BLOOD (ROUTINE X 2)     Status: None   Collection Time    05/08/13  7:17 PM      Result Value Ref Range Status   Specimen Description LEFT ANTECUBITAL   Final   Special Requests BOTTLES DRAWN AEROBIC AND ANAEROBIC 10CC   Final   Culture NO GROWTH 5 DAYS   Final   Report Status 05/13/2013 FINAL   Final  CULTURE, BLOOD (ROUTINE X 2)     Status: None   Collection Time    05/08/13  7:20 PM      Result Value Ref Range Status   Specimen Description RIGHT ANTECUBITAL   Final   Special Requests BOTTLES DRAWN AEROBIC AND ANAEROBIC 10CC   Final   Culture NO GROWTH 5 DAYS   Final   Report Status 05/13/2013 FINAL   Final  CLOSTRIDIUM DIFFICILE BY PCR     Status:  None   Collection Time    05/14/13  6:08 AM      Result Value Ref Range Status   C difficile by pcr NEGATIVE  NEGATIVE Final   Medical History: Past Medical History  Diagnosis Date  . Arthritis   . Asthma   . COPD (chronic obstructive pulmonary disease)   . Thyroid disorder 10/31/2010   Medications:  Scheduled:  . albuterol  2.5 mg Nebulization Q4H WA  . enoxaparin (LOVENOX) injection  30 mg Subcutaneous Q24H  . feeding supplement (PRO-STAT SUGAR FREE 64)  30 mL Oral TID WC  . ketorolac  1 drop Both Eyes QID  . levofloxacin  750 mg Oral q1800  . magic mouthwash  10 mL Oral QID  . methimazole  2.5 mg Oral QODAY  . mometasone-formoterol  2 puff Inhalation BID  . predniSONE  50 mg Oral Q breakfast  . sodium chloride  3 mL Intravenous Q12H  . tiotropium  18 mcg Inhalation Daily   Assessment: 78 yo F with CAP. She was seen in the ED on 4/6 and discharged with Ceftin bid & Levaquin 500mg  po daily.  She reported started taking these but due to worsening shortness of breath, she was referred for admission.  Renal function is at patient's baseline.  WBC improved. Estimated Normalized CrCl is > 10650ml/min.  SCr = 0.52 Pt is clinically improved per MD, Afebrile.  Levaquin 4/7>>  Goal of Therapy:  Eradicate infection.  Plan:   Continue Levaquin 750mg  PO q24hrs     Monitor renal function and cx data as indicated  Anticipate 8 days total of Levaquin Rx (duration per MD)  Wayland DenisScott A Ritchey 05/15/2013,7:45 AM

## 2013-05-15 NOTE — Progress Notes (Signed)
Subjective: She feels okay. She thinks her problem with her mouth was related to using Spiriva. Her mouth is better. She's able to eat now. Her breathing is better  Objective: Vital signs in last 24 hours: Temp:  [97.8 F (36.6 C)-97.9 F (36.6 C)] 97.8 F (36.6 C) (04/13 0656) Pulse Rate:  [82-96] 82 (04/13 0656) Resp:  [20] 20 (04/13 0656) BP: (106-128)/(63-64) 106/64 mmHg (04/13 0656) SpO2:  [90 %-100 %] 100 % (04/13 0732) Weight change:  Last BM Date: 05/14/13  Intake/Output from previous day: 04/12 0701 - 04/13 0700 In: 700 [I.V.:700] Out: -   PHYSICAL EXAM General appearance: alert, cooperative, mild distress and Very thin Resp: clear to auscultation bilaterally Cardio: regular rate and rhythm, S1, S2 normal, no murmur, click, rub or gallop GI: soft, non-tender; bowel sounds normal; no masses,  no organomegaly Extremities: extremities normal, atraumatic, no cyanosis or edema  Lab Results:    Basic Metabolic Panel: No results found for this basename: NA, K, CL, CO2, GLUCOSE, BUN, CREATININE, CALCIUM, MG, PHOS,  in the last 72 hours Liver Function Tests: No results found for this basename: AST, ALT, ALKPHOS, BILITOT, PROT, ALBUMIN,  in the last 72 hours No results found for this basename: LIPASE, AMYLASE,  in the last 72 hours No results found for this basename: AMMONIA,  in the last 72 hours CBC: No results found for this basename: WBC, NEUTROABS, HGB, HCT, MCV, PLT,  in the last 72 hours Cardiac Enzymes: No results found for this basename: CKTOTAL, CKMB, CKMBINDEX, TROPONINI,  in the last 72 hours BNP: No results found for this basename: PROBNP,  in the last 72 hours D-Dimer: No results found for this basename: DDIMER,  in the last 72 hours CBG: No results found for this basename: GLUCAP,  in the last 72 hours Hemoglobin A1C: No results found for this basename: HGBA1C,  in the last 72 hours Fasting Lipid Panel: No results found for this basename: CHOL, HDL,  LDLCALC, TRIG, CHOLHDL, LDLDIRECT,  in the last 72 hours Thyroid Function Tests: No results found for this basename: TSH, T4TOTAL, FREET4, T3FREE, THYROIDAB,  in the last 72 hours Anemia Panel: No results found for this basename: VITAMINB12, FOLATE, FERRITIN, TIBC, IRON, RETICCTPCT,  in the last 72 hours Coagulation: No results found for this basename: LABPROT, INR,  in the last 72 hours Urine Drug Screen: Drugs of Abuse  No results found for this basename: labopia, cocainscrnur, labbenz, amphetmu, thcu, labbarb    Alcohol Level: No results found for this basename: ETH,  in the last 72 hours Urinalysis: No results found for this basename: COLORURINE, APPERANCEUR, LABSPEC, PHURINE, GLUCOSEU, HGBUR, BILIRUBINUR, KETONESUR, PROTEINUR, UROBILINOGEN, NITRITE, LEUKOCYTESUR,  in the last 72 hours Misc. Labs:  ABGS No results found for this basename: PHART, PCO2, PO2ART, TCO2, HCO3,  in the last 72 hours CULTURES Recent Results (from the past 240 hour(s))  CULTURE, BLOOD (ROUTINE X 2)     Status: None   Collection Time    05/08/13  7:17 PM      Result Value Ref Range Status   Specimen Description LEFT ANTECUBITAL   Final   Special Requests BOTTLES DRAWN AEROBIC AND ANAEROBIC 10CC   Final   Culture NO GROWTH 5 DAYS   Final   Report Status 05/13/2013 FINAL   Final  CULTURE, BLOOD (ROUTINE X 2)     Status: None   Collection Time    05/08/13  7:20 PM      Result Value Ref Range  Status   Specimen Description RIGHT ANTECUBITAL   Final   Special Requests BOTTLES DRAWN AEROBIC AND ANAEROBIC 10CC   Final   Culture NO GROWTH 5 DAYS   Final   Report Status 05/13/2013 FINAL   Final  CLOSTRIDIUM DIFFICILE BY PCR     Status: None   Collection Time    05/14/13  6:08 AM      Result Value Ref Range Status   C difficile by pcr NEGATIVE  NEGATIVE Final   Studies/Results: No results found.  Medications:  Prior to Admission:  Prescriptions prior to admission  Medication Sig Dispense Refill  .  albuterol (PROVENTIL HFA;VENTOLIN HFA) 108 (90 BASE) MCG/ACT inhaler Inhale 2 puffs into the lungs every 4 (four) hours as needed for wheezing or shortness of breath.  1 Inhaler  0  . albuterol (PROVENTIL) (2.5 MG/3ML) 0.083% nebulizer solution Take 2.5 mg by nebulization every 6 (six) hours as needed for wheezing or shortness of breath.      . cefUROXime (CEFTIN) 500 MG tablet Take 1 tablet (500 mg total) by mouth 2 (two) times daily with a meal.  20 tablet  0  . cholecalciferol (VITAMIN D) 1000 UNITS tablet Take 1,000 Units by mouth daily.        Marland Kitchen. dexamethasone (DECADRON) 2 MG tablet Take 1-2 tablets (2-4 mg total) by mouth daily. Take 4mg  for 2days then 2mg  for 2days then STOP  6 tablet  0  . ketorolac (ACULAR) 0.4 % SOLN Place 1 drop into both eyes 4 (four) times daily.      Marland Kitchen. levofloxacin (LEVAQUIN) 500 MG tablet Take 1 tablet (500 mg total) by mouth daily. X 7 days  10 tablet  0  . methimazole (TAPAZOLE) 5 MG tablet Take 2.5 mg by mouth every other day.       Marland Kitchen. omeprazole (PRILOSEC) 20 MG capsule Take 1 capsule by mouth daily.      . OXYGEN-HELIUM IN Inhale 2 L into the lungs continuous.      . Simethicone (GAS RELIEF PO) Take 1-2 tablets by mouth daily as needed (gas relief).        Scheduled: . albuterol  2.5 mg Nebulization Q4H WA  . enoxaparin (LOVENOX) injection  30 mg Subcutaneous Q24H  . feeding supplement (PRO-STAT SUGAR FREE 64)  30 mL Oral TID WC  . ketorolac  1 drop Both Eyes QID  . levofloxacin  750 mg Oral q1800  . magic mouthwash  10 mL Oral QID  . methimazole  2.5 mg Oral QODAY  . mometasone-formoterol  2 puff Inhalation BID  . predniSONE  50 mg Oral Q breakfast  . sodium chloride  3 mL Intravenous Q12H   Continuous: . sodium chloride 75 mL/hr at 05/15/13 0431   ZOX:WRUEAVPRN:sodium chloride, acetaminophen, acetaminophen, albuterol, ALPRAZolam, ondansetron (ZOFRAN) IV, simethicone, sodium chloride  Assesment: She was admitted with acute on chronic respiratory failure related  to severe COPD and pneumonia. She seems to be improving. She had trouble with diarrhea but did not have C. difficile. She had trouble with swallowing she relates taking Spiriva so I have discontinued that. She has severe protein calorie malnutrition and has severe deconditioning Active Problems:   Respiratory failure, acute and chronic   Protein-calorie malnutrition, severe   COPD (chronic obstructive pulmonary disease)   Pneumonia    Plan: Continue current treatments. She had agreed to placement if needed    LOS: 5 days   Fredirick Maudlindward L Delcia Spitzley 05/15/2013, 8:58 AM

## 2013-05-15 NOTE — Progress Notes (Signed)
Patient states that she will not be taking the spiriva anymore due to it irritating her throat.

## 2013-05-15 NOTE — Progress Notes (Addendum)
Pt [facility] today. Report has been called to receiving nurse, Marchelle FolksAmanda. Pt's family member to transport pt to facility. Pt is in NAD, IV is out, all paperwork has been reviewed/discussed with patient, and there are no questions/concerns at this time. Assessment is unchanged from this morning. Pt is to be accompanied downstairs by staff and family via wheelchair.

## 2013-05-15 NOTE — Clinical Social Work Placement (Signed)
Clinical Social Work Department CLINICAL SOCIAL WORK PLACEMENT NOTE 05/15/2013  Patient:  Emily FrayHALL,[name]  Account Number:  [id number] Admit date:  05/10/2013  Clinical Social Worker:  Derenda FennelKARA Carlton Buskey, LCSW  Date/time:  05/15/2013 09:26 AM  Clinical Social Work is seeking post-discharge placement for this patient at the following level of care:   SKILLED NURSING   (*CSW will update this form in Epic as items are completed)   05/15/2013  Patient/family provided with [facility] Department of Clinical Social Work's list of facilities offering this level of care within the geographic area requested by the patient (or if unable, by the patient's family).  05/15/2013  Patient/family informed of their freedom to choose among providers that offer the needed level of care, that participate in Medicare, Medicaid or managed care program needed by the patient, have an available bed and are willing to accept the patient.  05/15/2013  Patient/family informed of [facility]' ownership interest in [facility], as well as of the fact that they are under no obligation to receive care at this facility.  PASARR submitted to EDS on  PASARR number received from EDS on   FL2 transmitted to all facilities in geographic area requested by pt/family on  05/15/2013 FL2 transmitted to all facilities within larger geographic area on   Patient informed that his/her managed care company has contracts with or will negotiate with  certain facilities, including the following:     Patient/family informed of bed offers received:   Patient chooses bed at  Physician recommends and patient chooses bed at    Patient to be transferred to  on   Patient to be transferred to facility by   The following physician request were entered in Epic:   Additional Comments: Pt has existing pasarr.  Derenda FennelKara Tyona Nilsen, [place] [phone]

## 2013-05-15 NOTE — Progress Notes (Signed)
Pt received the COPD gold packet with card number [id number]. Pt has no new concerns at this time and teach back method was used when going over packet. Will continue to monitor.

## 2013-05-15 NOTE — Clinical Social Work Note (Signed)
CSW presented bed offers and [facility] Facility notified. Pt d/c today and has called niece to transport. CSW will fax d/c summary upon completion.  Derenda FennelKara Koya Hunger, [place] [phone]

## 2013-05-15 NOTE — Discharge Summary (Signed)
Physician Discharge Summary  [name] EAV:[id number] DOB: 04/28/1935 DOA: 05/10/2013  PCP: Catalina PizzaHALL, ZACH, MD  Admit date: 05/10/2013 Discharge date: 05/15/2013  Time spent: 40 minutes  Recommendations for Outpatient Follow-up:  1. Discharge to [facility] for [facility]  Discharge Diagnoses:  Active Problems:   Respiratory failure, acute and chronic   Protein-calorie malnutrition, severe   COPD (chronic obstructive pulmonary disease)   Pneumonia   Discharge Condition: improved  Diet recommendation: low salt  Filed Weights   05/10/13 1632  Weight: 36.968 kg (81 lb 8 oz)    History of present illness:  78 year old female who has a past medical history of Arthritis; Asthma; COPD (chronic obstructive pulmonary disease); and Thyroid disorder (10/31/2010). patient has been having worsening shortness of breath, she was seen in the ED on 05/08/2013 and was sent home on antibiotics for diagnosis of pneumonia. Patient was seen by Dr. Juanetta GoslingHawkins in his clinic today and as patient continued to have shortness of breath he sent the patient to the hospital for admission. Patient has history of COPD and is on home oxygen, she says that breathing is worse on walking, she denies fever also has been having cough with white-colored phlegm. She denies nausea vomiting or diarrhea.  Patient was started on Levaquin and she took her first dose yesterday. She had taken her dose of Levaquin today before coming to the hospital.   Hospital Course:  Patient was admitted to the hospital with acute on chronic respiratory failure due to COPD and community acquired pneumonia. Chest her on intravenous Levaquin, bronchodilators and IV steroids. With her chronic lung disease, her progress was slow. She has now appeared to have returned to her baseline respiratory status. She has been started on a prednisone taper. Continue bronchodilators. Antibiotics were changed to by mouth. She's been afebrile.  She'll be discharged to a skilled nursing facility for further physical rehabilitation.  Procedures:    Consultations:  Pulmonology  Discharge Exam: Filed Vitals:   05/15/13 0656  BP: 106/64  Pulse: 82  Temp: 97.8 F (36.6 C)  Resp: 20    General: NAD Cardiovascular: s1, s2, rrr Respiratory: diminished breath sounds bilaterally  Discharge Instructions You were cared for by a hospitalist during your hospital stay. If you have any questions about your discharge medications or the care you received while you were in the hospital after you are discharged, you can call the unit and asked to speak with the hospitalist on call if the hospitalist that took care of you is not available. Once you are discharged, your primary care physician will handle any further medical issues. Please note that NO REFILLS for any discharge medications will be authorized once you are discharged, as it is imperative that you return to your primary care physician (or establish a relationship with a primary care physician if you do not have one) for your aftercare needs so that they can reassess your need for medications and monitor your lab values.  Discharge Orders   Future Orders Complete By Expires   Call MD for:  difficulty breathing, headache or visual disturbances  As directed    Diet - low sodium heart healthy  As directed    Increase activity slowly  As directed        Medication List    STOP taking these medications       cefUROXime 500 MG tablet  Commonly known as:  CEFTIN     dexamethasone 2 MG tablet  Commonly known  as:  DECADRON      TAKE these medications       albuterol (2.5 MG/3ML) 0.083% nebulizer solution  Commonly known as:  PROVENTIL  Take 2.5 mg by nebulization every 6 (six) hours as needed for wheezing or shortness of breath.     albuterol 108 (90 BASE) MCG/ACT inhaler  Commonly known as:  PROVENTIL HFA;VENTOLIN HFA  Inhale 2 puffs into the lungs every 4 (four) hours as  needed for wheezing or shortness of breath.     cholecalciferol 1000 UNITS tablet  Commonly known as:  VITAMIN D  Take 1,000 Units by mouth daily.     GAS RELIEF PO  Take 1-2 tablets by mouth daily as needed (gas relief).     ketorolac 0.4 % Soln  Commonly known as:  ACULAR  Place 1 drop into both eyes 4 (four) times daily.     levofloxacin 750 MG tablet  Commonly known as:  LEVAQUIN  Take 1 tablet (750 mg total) by mouth daily at 6 PM.     magic mouthwash Soln  Take 10 mLs by mouth 4 (four) times daily.     methimazole 5 MG tablet  Commonly known as:  TAPAZOLE  Take 2.5 mg by mouth every other day.     mometasone-formoterol 200-5 MCG/ACT Aero  Commonly known as:  DULERA  Inhale 2 puffs into the lungs 2 (two) times daily.     omeprazole 20 MG capsule  Commonly known as:  PRILOSEC  Take 1 capsule by mouth daily.     OXYGEN-HELIUM IN  Inhale 2 L into the lungs continuous.     predniSONE 10 MG tablet  Commonly known as:  DELTASONE  Take 50mg  po daily for 2 days then decrease by 10mg  every 2 days until complete       Allergies  Allergen Reactions  . Promist La [Pseudoephedrine-Guaifenesin] Shortness Of Breath  . Sulfa Antibiotics Nausea And Vomiting  . Aspirin Other (See Comments)    "FELT AS IF SHE WOULD DIE"  . Iodine Other (See Comments)    FELT "BAD" EXAUSTED   . Latex Itching    PLASTIC BANDAIDS  . Prednisone Palpitations    SPEEDS HEART RATE       Follow-up Information   Follow up with [facility].   Contact information:   [address] [place] [address] [phone]        The results of significant diagnostics from this hospitalization (including imaging, microbiology, ancillary and laboratory) are listed below for reference.    Significant Diagnostic Studies: Dg Chest Portable 1 View  05/08/2013   CLINICAL DATA:  Shortness of breath.  COPD.  EXAM: PORTABLE CHEST - 1 VIEW  COMPARISON:  DG CHEST 1V PORT dated  04/26/2013  FINDINGS: New airspace opacity is present in the right midlung overlying the right inferior scapular margin. Given the appearance compared to prior, this likely represents a small focus of pneumonia. Linear scarring is present in the lingula extend into the left costophrenic angle. Severe emphysema. Cardiopericardial silhouette and mediastinal contours are unchanged. Monitoring leads project over the chest.  IMPRESSION: New airspace opacity in the right midlung most compatible with pneumonia. Followup to ensure radiographic clearing and exclude an underlying lesion is recommended. Typically clearing will be observed at 8 weeks.   Electronically Signed   By: Andreas Newport M.D.   On: 05/08/2013 16:37   Dg Chest Portable 1 View  04/26/2013   CLINICAL DATA:  Shortness  of breath for 3-4 days.  Former smoker.  EXAM: PORTABLE CHEST - 1 VIEW  COMPARISON:  DG CHEST 1V PORT dated 04/23/2013  FINDINGS: Severe COPD. No active infiltrates or failure. Normal cardiomediastinal silhouette. Osseous demineralization. Chronic bilateral CP angle blunting.  IMPRESSION: Severe COPD, no active disease.   Electronically Signed   By: Davonna Belling M.D.   On: 04/26/2013 15:10   Dg Chest Portable 1 View  04/23/2013   CLINICAL DATA:  COPD.  Shortness of breath.  EXAM: PORTABLE CHEST - 1 VIEW  COMPARISON:  Chest x-ray 02/28/2013.  FINDINGS: Lungs are hyperexpanded with flattening of the hemidiaphragms and pruning of the pulmonary vasculature in the periphery, compatible with advanced emphysema. No acute consolidative airspace disease. No pleural effusions. No evidence of pulmonary edema. Heart size is normal. Mediastinal contours are unremarkable. Atherosclerosis in the thoracic aorta.  IMPRESSION: 1. Advanced emphysematous changes redemonstrated, as above, without radiographic evidence of acute cardiopulmonary disease.   Electronically Signed   By: Trudie Reed M.D.   On: 04/23/2013 21:54    Microbiology: Recent  Results (from the past 240 hour(s))  CULTURE, BLOOD (ROUTINE X 2)     Status: None   Collection Time    05/08/13  7:17 PM      Result Value Ref Range Status   Specimen Description LEFT ANTECUBITAL   Final   Special Requests BOTTLES DRAWN AEROBIC AND ANAEROBIC 10CC   Final   Culture NO GROWTH 5 DAYS   Final   Report Status 05/13/2013 FINAL   Final  CULTURE, BLOOD (ROUTINE X 2)     Status: None   Collection Time    05/08/13  7:20 PM      Result Value Ref Range Status   Specimen Description RIGHT ANTECUBITAL   Final   Special Requests BOTTLES DRAWN AEROBIC AND ANAEROBIC 10CC   Final   Culture NO GROWTH 5 DAYS   Final   Report Status 05/13/2013 FINAL   Final  CLOSTRIDIUM DIFFICILE BY PCR     Status: None   Collection Time    05/14/13  6:08 AM      Result Value Ref Range Status   C difficile by pcr NEGATIVE  NEGATIVE Final     Labs: Basic Metabolic Panel:  Recent Labs Lab 05/08/13 1636 05/10/13 2100 05/11/13 0456  NA 146 143 143  K 4.0 4.1 3.8  CL 104 103 103  CO2 36* 31 31  GLUCOSE 124* 118* 102*  BUN 14 15 13   CREATININE 0.76 0.52 0.52  CALCIUM 9.5 9.4 9.2   Liver Function Tests:  Recent Labs Lab 05/10/13 2100 05/11/13 0456  AST 15 15  ALT 8 8  ALKPHOS 57 57  BILITOT 0.2* 0.3  PROT 5.8* 5.8*  ALBUMIN 2.9* 2.8*   No results found for this basename: LIPASE, AMYLASE,  in the last 168 hours No results found for this basename: AMMONIA,  in the last 168 hours CBC:  Recent Labs Lab 05/08/13 1636 05/10/13 2100 05/11/13 0456  WBC 11.1* 11.8* 9.7  NEUTROABS 8.4*  --   --   HGB 12.5 11.0* 11.0*  HCT 40.0 33.9* 34.1*  MCV 88.9 86.0 86.5  PLT 314 329 353   Cardiac Enzymes: No results found for this basename: CKTOTAL, CKMB, CKMBINDEX, TROPONINI,  in the last 168 hours BNP: BNP (last 3 results)  Recent Labs  02/28/13 1335 04/26/13 1503  PROBNP 76.1 95.5   CBG: No results found for this basename: GLUCAP,  in the  last 168  hours     Signed:  Erick BlinksJehanzeb Aodhan Scheidt  [facility] 05/15/2013, 2:09 PM

## 2013-08-02 DEATH — deceased

## 2013-08-16 ENCOUNTER — Telehealth: Payer: Self-pay

## 2013-08-16 NOTE — Telephone Encounter (Signed)
[facility] per Ileene Hutchinsonbituary

## 2014-12-20 IMAGING — CR DG CHEST 2V
2 series · 2 of 2 positions shown · non-contrast
Comparison: Single view of the chest 11/03/2010.

CLINICAL DATA: Back pain cough.  COPD.

EXAM:
CHEST  2 VIEW

[view not recorded (1 of 2)]
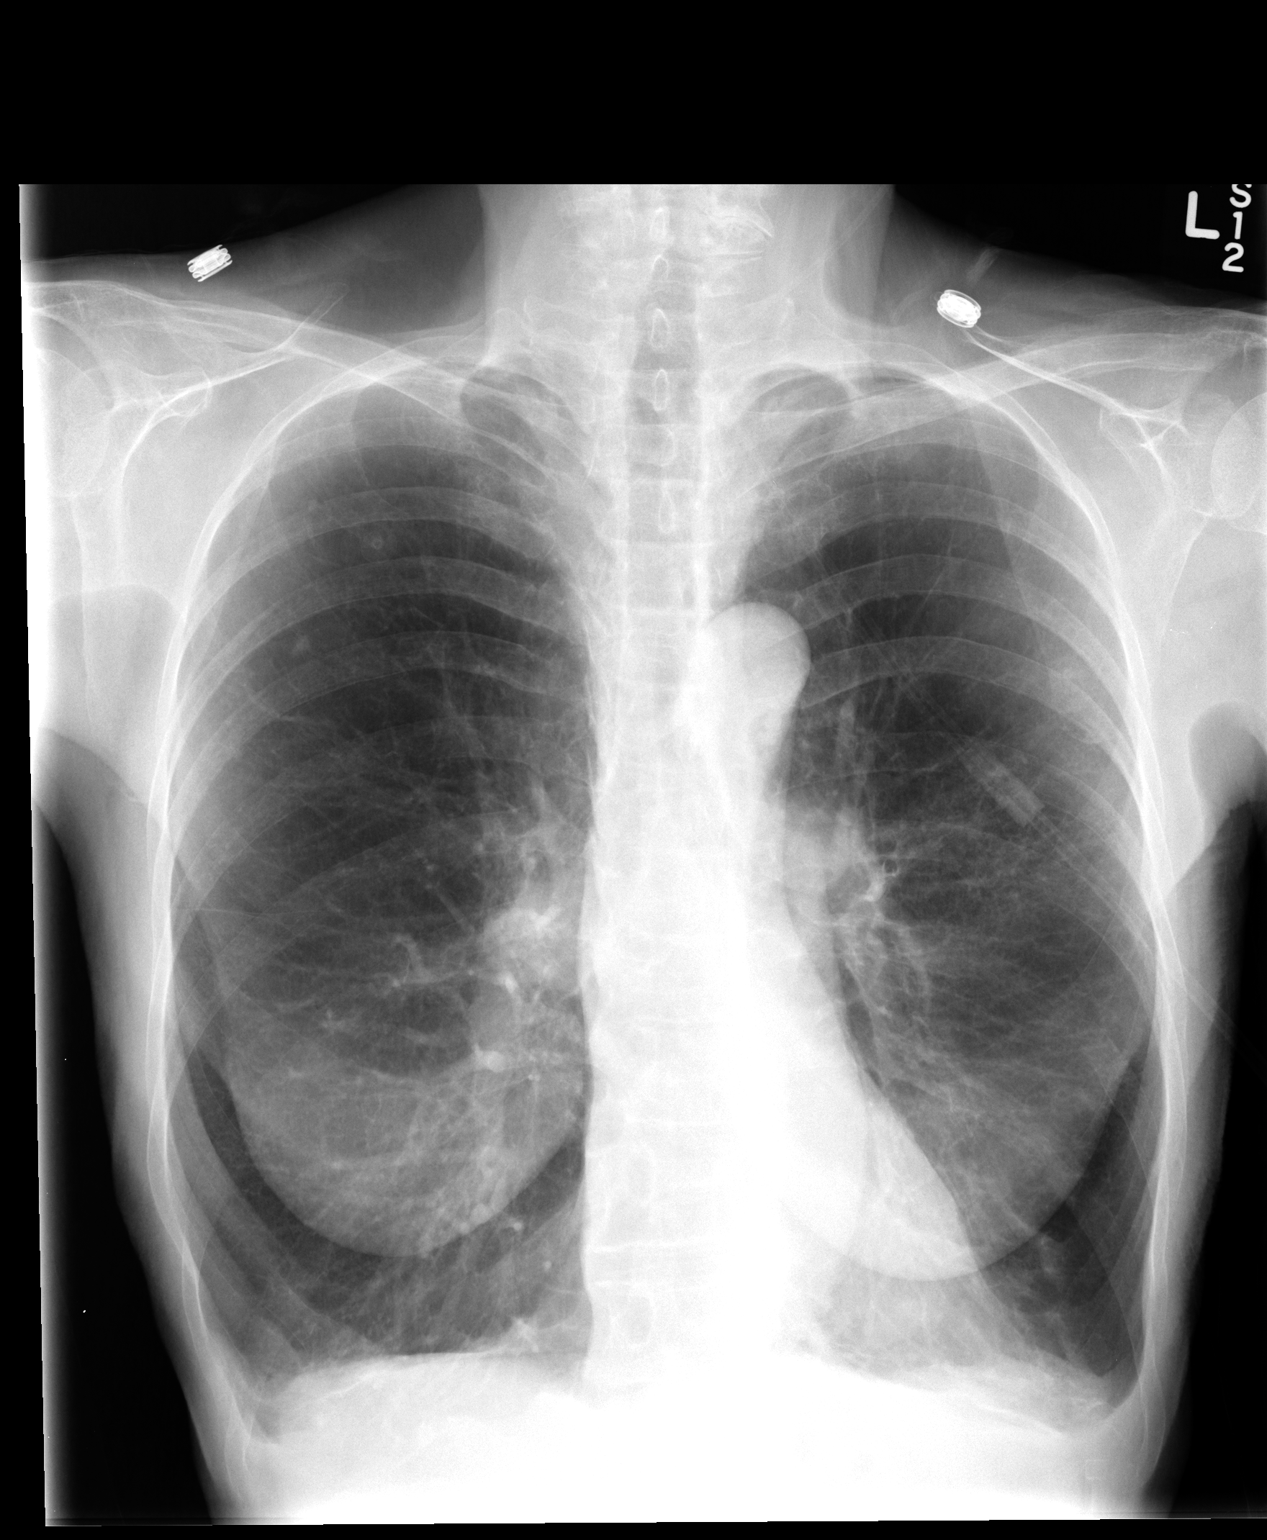

[view not recorded (2 of 2)]
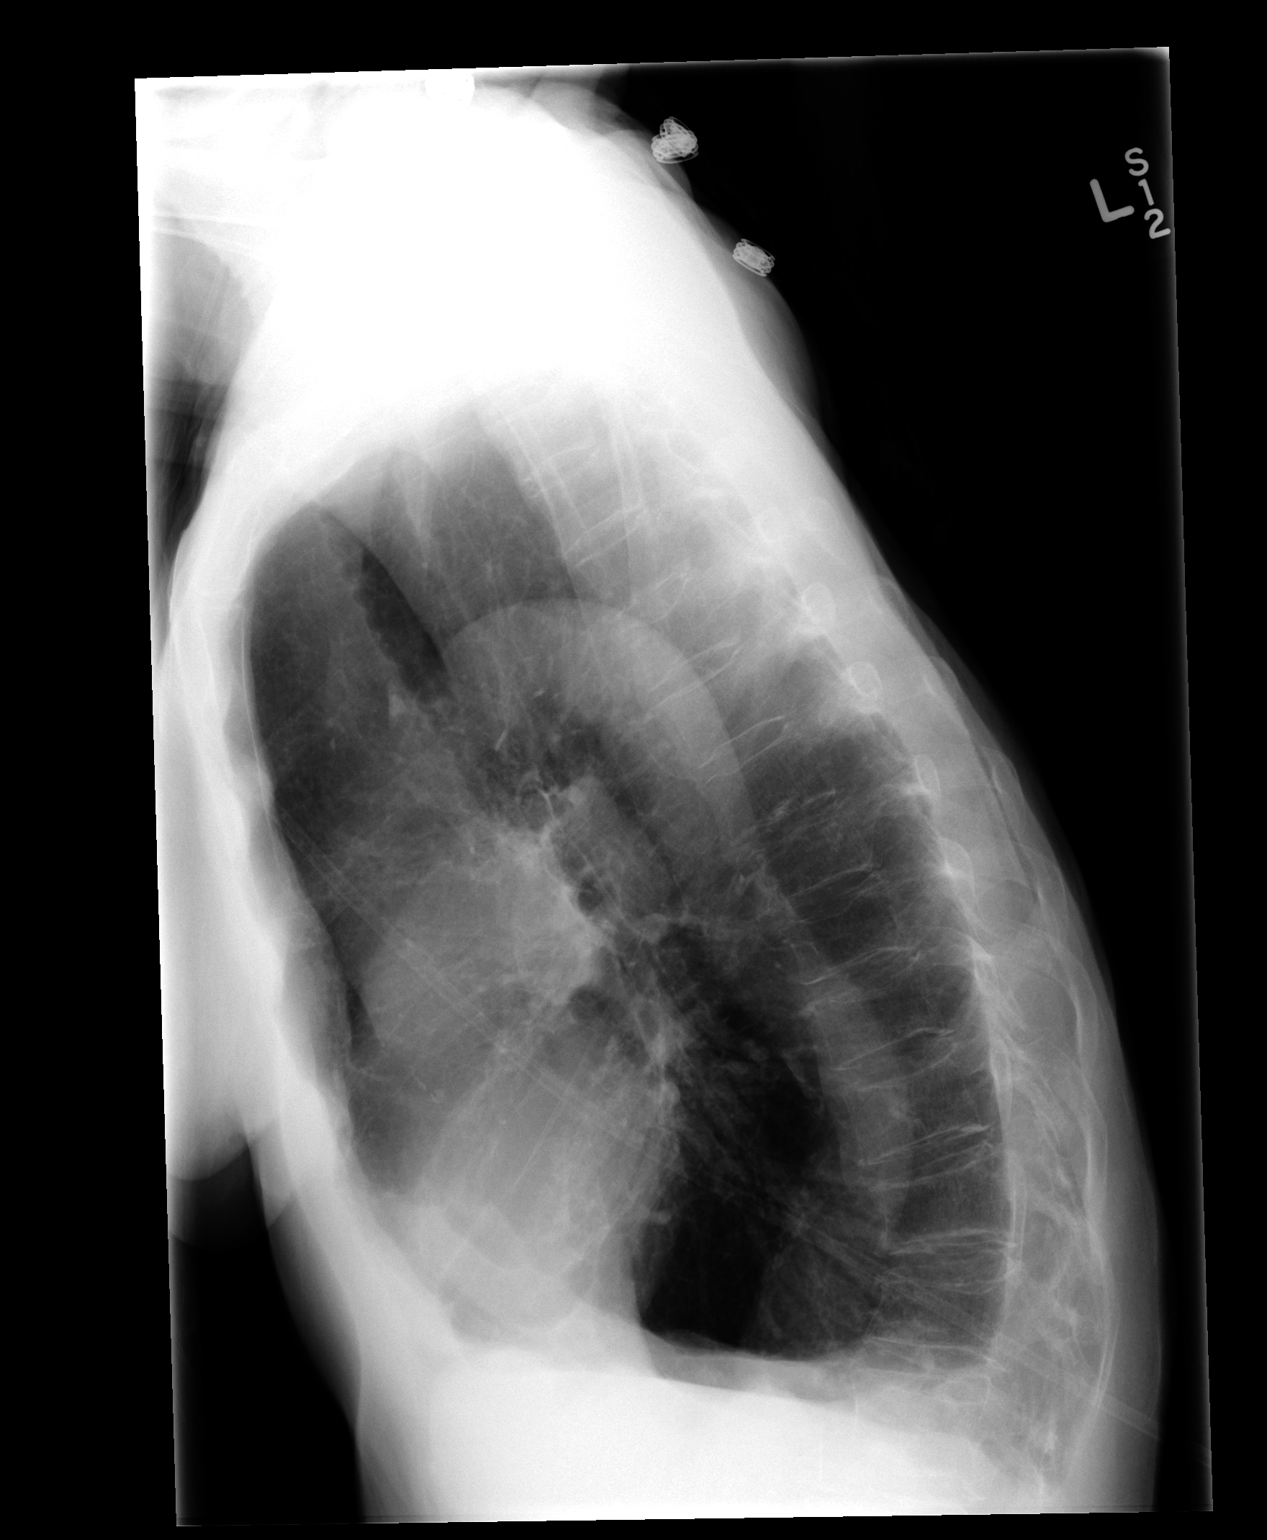

[2 of 2 positions shown; findings below may reference images not displayed]

FINDINGS: The chest is markedly hyperexpanded consistent with severe
emphysema. There is near inversion of the hemidiaphragms. No
consolidative process, pneumothorax or effusion. Heart size is
normal.
IMPRESSION: Severe appearing emphysema.  No acute disease.
